# Patient Record
Sex: Male | Born: 1995 | Race: White | Hispanic: No | Marital: Single | Smoking: Never smoker
Health system: Southern US, Community
[De-identification: ages and names within clinical notes are randomized; demographics above are authoritative.]

## PROBLEM LIST (undated history)

## (undated) DIAGNOSIS — F32A Depression, unspecified: Secondary | ICD-10-CM

## (undated) DIAGNOSIS — F419 Anxiety disorder, unspecified: Secondary | ICD-10-CM

## (undated) HISTORY — PX: ANTERIOR CRUCIATE LIGAMENT REPAIR: SHX115

---
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
## 2020-05-12 ENCOUNTER — Emergency Department: Payer: BC Managed Care – PPO

## 2020-05-12 ENCOUNTER — Encounter: Payer: Self-pay | Admitting: Emergency Medicine

## 2020-05-12 ENCOUNTER — Inpatient Hospital Stay
Admission: EM | Admit: 2020-05-12 | Discharge: 2020-05-16 | DRG: 083 | Disposition: A | Discharge status: Home / Self Care | Payer: BC Managed Care – PPO | Attending: Internal Medicine | Admitting: Internal Medicine

## 2020-05-12 ENCOUNTER — Other Ambulatory Visit: Payer: Self-pay

## 2020-05-12 DIAGNOSIS — S065X9A Traumatic subdural hemorrhage with loss of consciousness of unspecified duration, initial encounter: Principal | ICD-10-CM | POA: Diagnosis present

## 2020-05-12 DIAGNOSIS — R4182 Altered mental status, unspecified: Secondary | ICD-10-CM

## 2020-05-12 DIAGNOSIS — Z20822 Contact with and (suspected) exposure to covid-19: Secondary | ICD-10-CM | POA: Diagnosis present

## 2020-05-12 DIAGNOSIS — W19XXXA Unspecified fall, initial encounter: Secondary | ICD-10-CM | POA: Diagnosis present

## 2020-05-12 DIAGNOSIS — R569 Unspecified convulsions: Secondary | ICD-10-CM

## 2020-05-12 DIAGNOSIS — S065XAA Traumatic subdural hemorrhage with loss of consciousness status unknown, initial encounter: Secondary | ICD-10-CM | POA: Diagnosis present

## 2020-05-12 DIAGNOSIS — I619 Nontraumatic intracerebral hemorrhage, unspecified: Secondary | ICD-10-CM | POA: Diagnosis present

## 2020-05-12 DIAGNOSIS — E876 Hypokalemia: Secondary | ICD-10-CM | POA: Diagnosis present

## 2020-05-12 DIAGNOSIS — Z79899 Other long term (current) drug therapy: Secondary | ICD-10-CM

## 2020-05-12 DIAGNOSIS — N179 Acute kidney failure, unspecified: Secondary | ICD-10-CM | POA: Diagnosis present

## 2020-05-12 DIAGNOSIS — Y92531 Health care provider office as the place of occurrence of the external cause: Secondary | ICD-10-CM

## 2020-05-12 DIAGNOSIS — R809 Proteinuria, unspecified: Secondary | ICD-10-CM | POA: Diagnosis present

## 2020-05-12 DIAGNOSIS — R55 Syncope and collapse: Secondary | ICD-10-CM | POA: Diagnosis present

## 2020-05-12 DIAGNOSIS — W1839XA Other fall on same level, initial encounter: Secondary | ICD-10-CM | POA: Diagnosis present

## 2020-05-12 DIAGNOSIS — R001 Bradycardia, unspecified: Secondary | ICD-10-CM

## 2020-05-12 LAB — URINALYSIS, COMPLETE (UACMP) WITH MICROSCOPIC
Bilirubin Urine: NEGATIVE
Glucose, UA: 50 mg/dL — AB
Ketones, ur: 5 mg/dL — AB
Leukocytes,Ua: NEGATIVE
Nitrite: NEGATIVE
Protein, ur: >=300 mg/dL — AB
Specific Gravity, Urine: 1.018 (ref 1.005–1.030)
Squamous Epithelial / LPF: NONE SEEN (ref 0–5)
pH: 5 (ref 5.0–8.0)

## 2020-05-12 LAB — BASIC METABOLIC PANEL
Anion gap: 13 (ref 5–15)
BUN: 11 mg/dL (ref 6–20)
CO2: 24 mmol/L (ref 22–32)
Calcium: 9.1 mg/dL (ref 8.9–10.3)
Chloride: 102 mmol/L (ref 98–111)
Creatinine, Ser: 1.13 mg/dL (ref 0.61–1.24)
GFR, Estimated: >60 mL/min (ref >60)
Glucose, Bld: 165 mg/dL — ABNORMAL HIGH (ref 70–99)
Potassium: 2.8 mmol/L — ABNORMAL LOW (ref 3.5–5.1)
Sodium: 139 mmol/L (ref 135–145)

## 2020-05-12 LAB — CBC
HCT: 41.3 % (ref 39.0–52.0)
Hemoglobin: 14.9 g/dL (ref 13.0–17.0)
MCH: 29.2 pg (ref 26.0–34.0)
MCHC: 36.1 g/dL — ABNORMAL HIGH (ref 30.0–36.0)
MCV: 81 fL (ref 80.0–100.0)
Platelets: 242 10*3/uL (ref 150–400)
RBC: 5.1 MIL/uL (ref 4.22–5.81)
RDW: 12.1 % (ref 11.5–15.5)
WBC: 12.2 10*3/uL — ABNORMAL HIGH (ref 4.0–10.5)
nRBC: 0 % (ref 0.0–0.2)

## 2020-05-12 LAB — MAGNESIUM: Magnesium: 1.9 mg/dL (ref 1.7–2.4)

## 2020-05-12 LAB — PHOSPHORUS: Phosphorus: 2.2 mg/dL — ABNORMAL LOW (ref 2.5–4.6)

## 2020-05-12 IMAGING — CT CT HEAD W/O CM
3 series · 16 of 47 positions shown, 19 images · non-contrast
Comparison: None.

CLINICAL DATA: Syncope.  Fall with head injury

EXAM:
CT HEAD WITHOUT CONTRAST
TECHNIQUE: Contiguous axial images were obtained from the base of the skull
through the vertex without intravenous contrast.

[Series 2: head wo · axial · 0.46mm/px · z∈[-112,+23]mm · 10 of 33 slices shown, 13 images]
[im 3/33  brain]
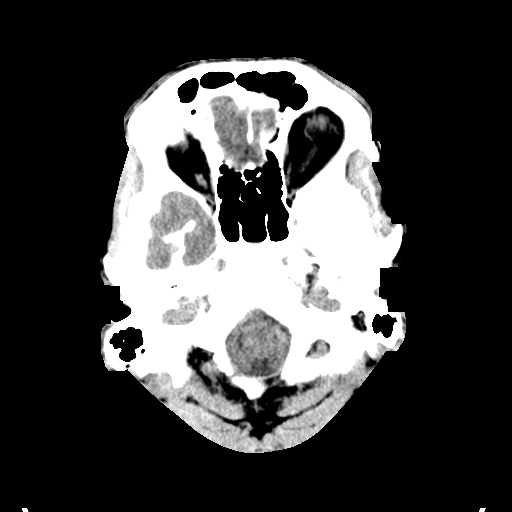
[im 3/33  bone]
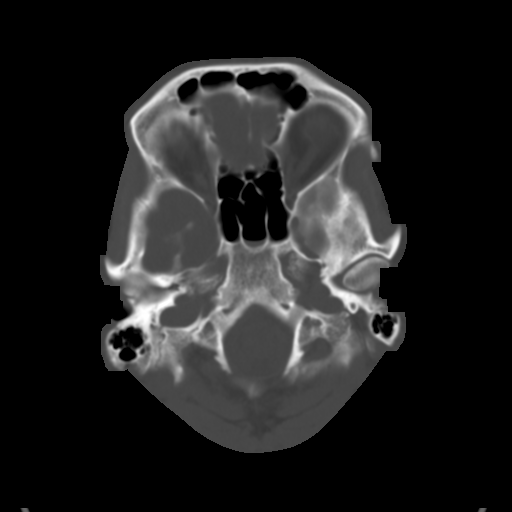
[im 6/33  brain]
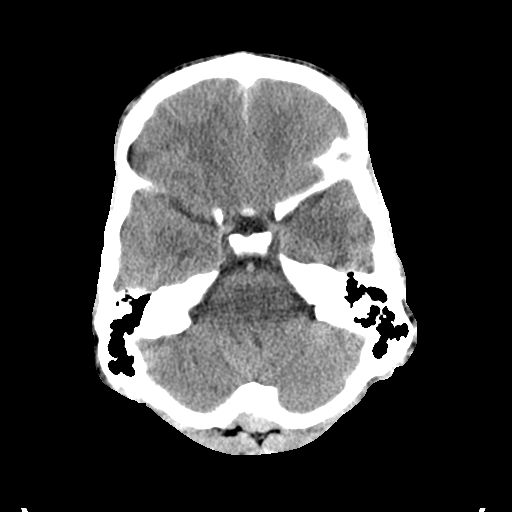
[im 9/33  brain]
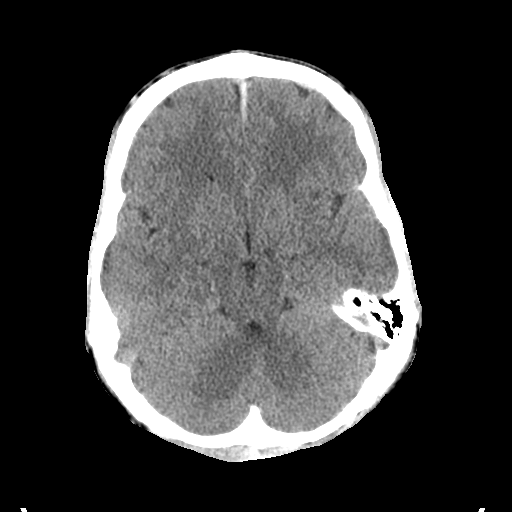
[im 12/33  brain]
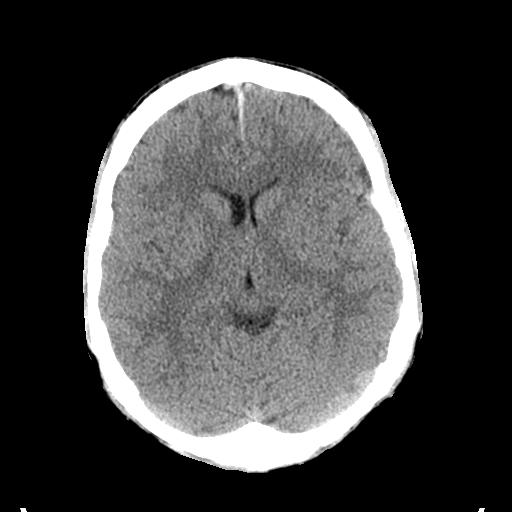
[im 15/33  brain]
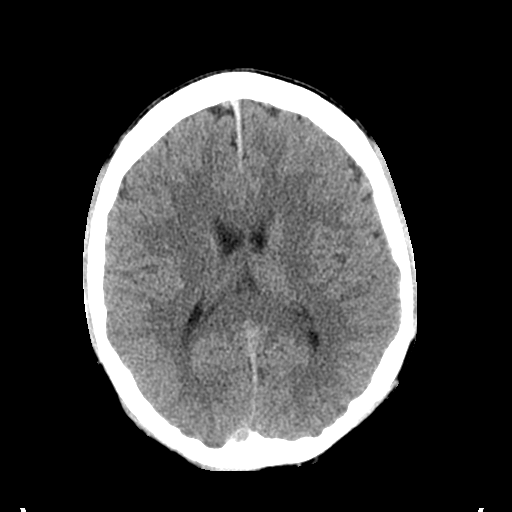
[im 15/33  bone]
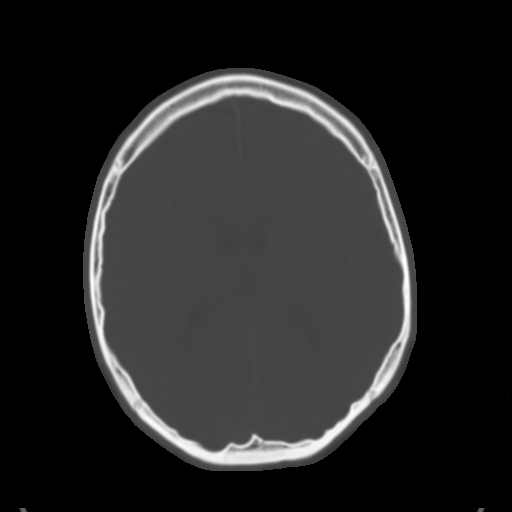
[im 18/33  brain]
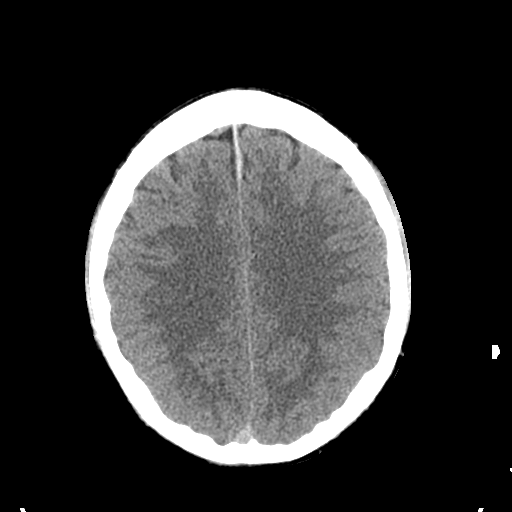
[im 21/33  brain]
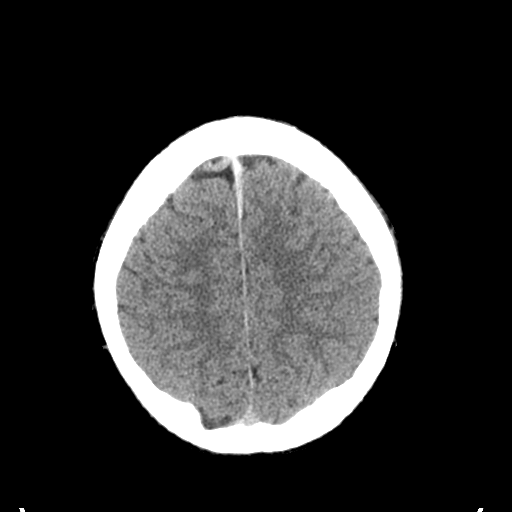
[im 25/33  brain]
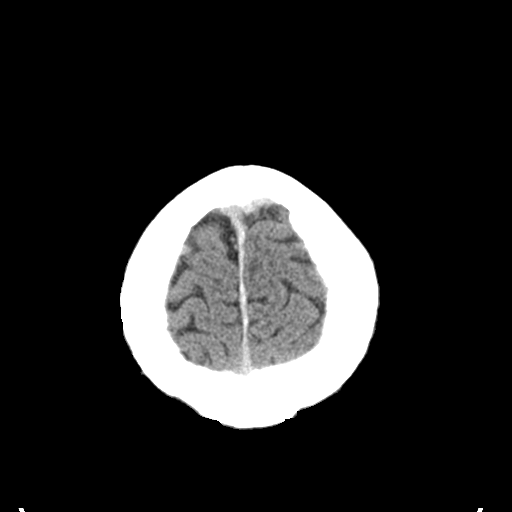
[im 27/33  brain]
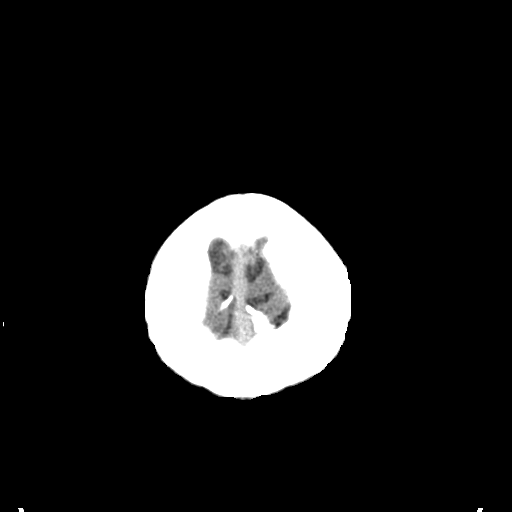
[im 27/33  bone]
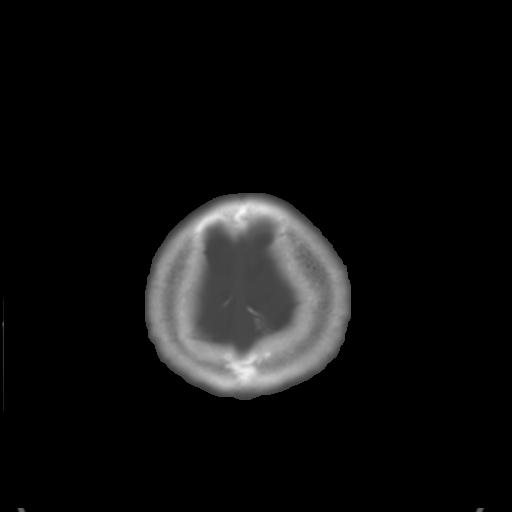
[im 30/33  brain]
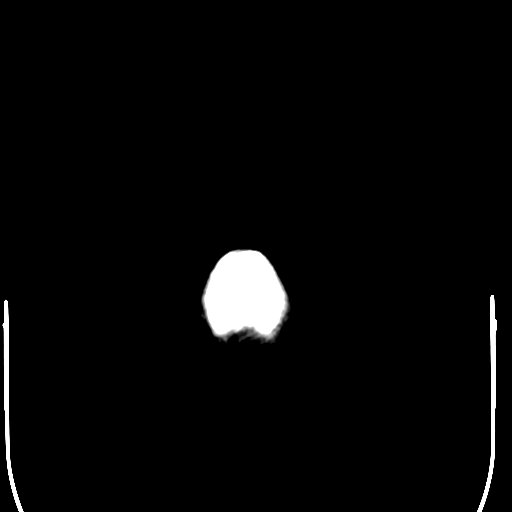

[Series 4: coronal soft tissue · coronal · 0.33mm/px · 3 of 67 slices shown]
[im 23/67  brain]
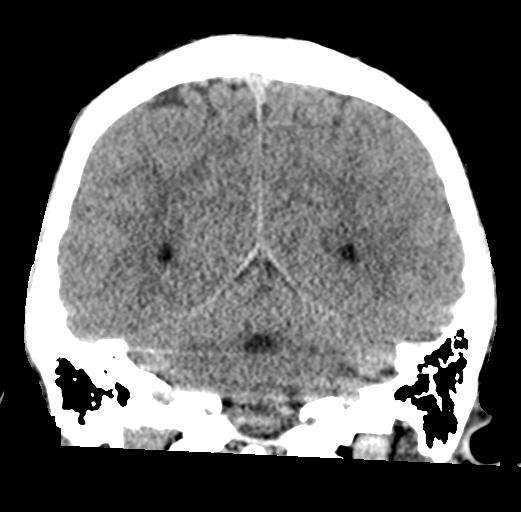
[im 30/67  brain]
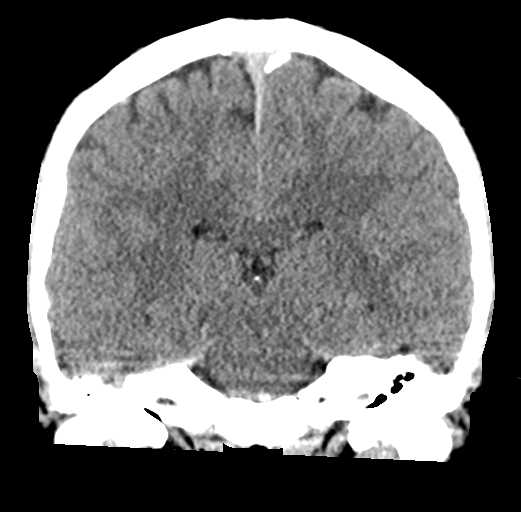
[im 37/67  brain]
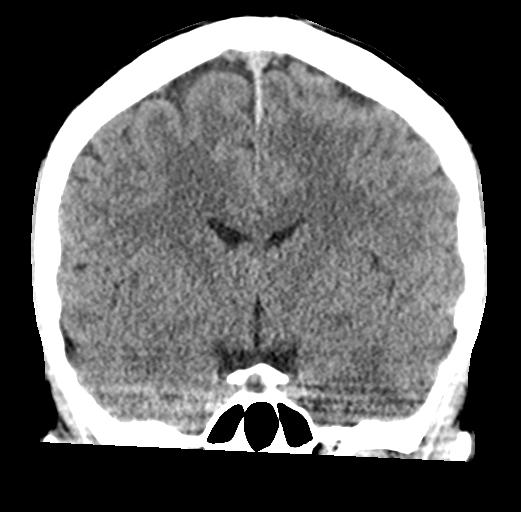

[Series 5: sagittal soft tissue · sagittal · 0.33mm/px · 3 of 58 slices shown]
[im 20/58  brain]
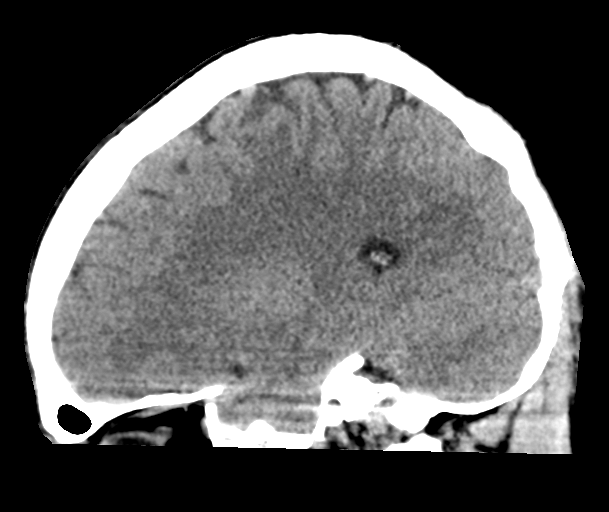
[im 29/58  brain]
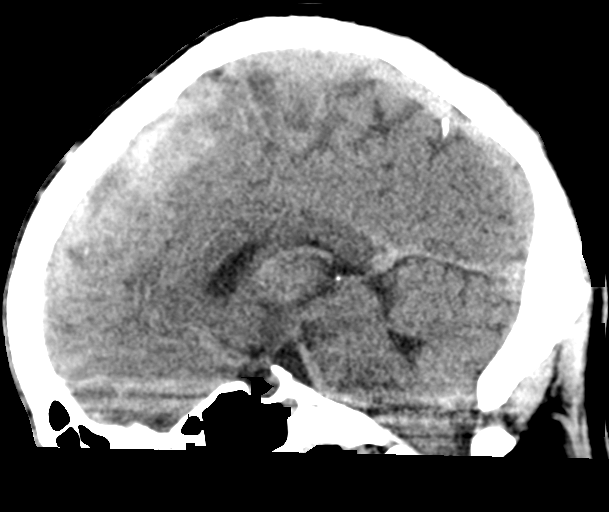
[im 39/58  brain]
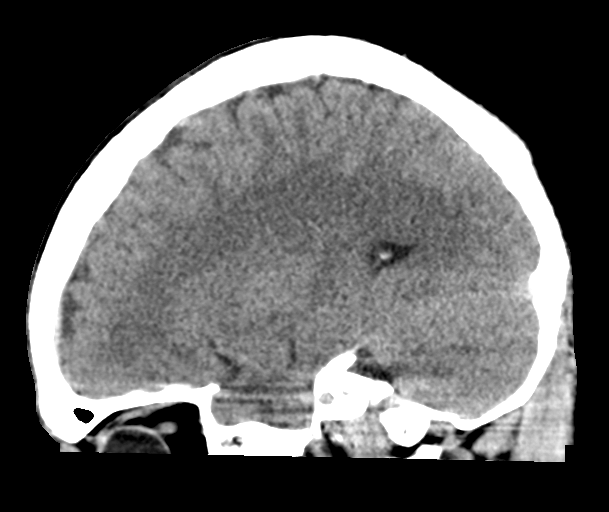

[16 of 47 positions shown; findings below may reference images not displayed]

FINDINGS: Brain: Acute interhemispheric subdural hematoma anteriorly measuring
approximately 3 mm in thickness. Additional small area of
hemorrhagic contusion in the left inferior frontal lobe measuring
approximately 5 mm. No subarachnoid hemorrhage.

Ventricle size normal.  Negative for acute infarct or mass lesion.

Vascular: Negative for hyperdense vessel

Skull: Right occipital skull fracture, acute appearing. Nondisplaced
fracture.

Sinuses/Orbits: Small air-fluid level in the sphenoid sinus. No
skull base fracture identified. Negative orbit

Other: None
IMPRESSION: 3 mm interhemispheric subdural hematoma anteriorly. Small
hemorrhagic contusion left inferior frontal lobe

Nondisplaced fracture right occipital bone.

These results were called by telephone at the time of interpretation
on [DATE] at [DATE] to provider LIAM, who verbally
acknowledged these results.

## 2020-05-12 IMAGING — CT CT HEAD W/O CM
2 of 3 series · 12 of 30 positions shown, 14 images · non-contrast
Comparison: CT head [DATE]

CLINICAL DATA: Fall.  Re-evaluate bleed

EXAM:
CT HEAD WITHOUT CONTRAST
CT CERVICAL SPINE WITHOUT CONTRAST
TECHNIQUE: Multidetector CT imaging of the head and cervical spine was
performed following the standard protocol without intravenous
contrast. Multiplanar CT image reconstructions of the cervical spine
were also generated.

[Series 4: coronal soft tissue · axial · 0.37mm/px · z∈[+738,+879]mm · 7 of 64 slices shown, 9 images (1 of 2)]
[im 8/64  brain]
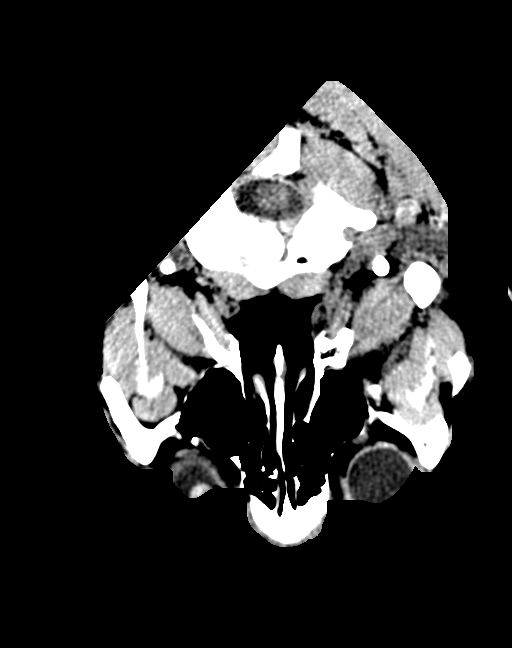
[im 8/64  bone]
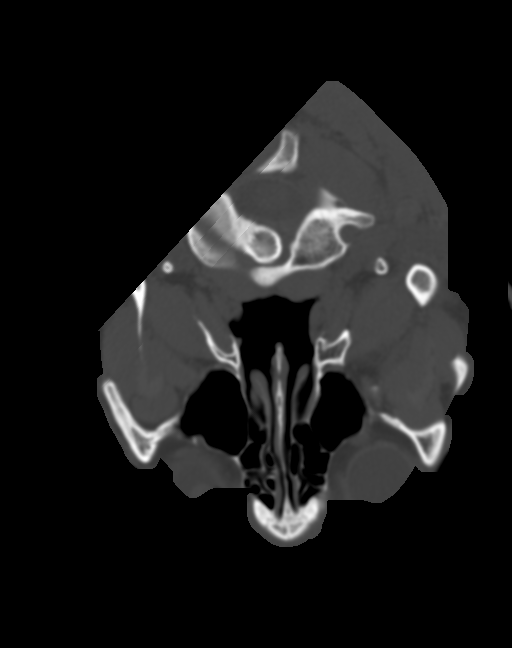
[im 16/64  brain]
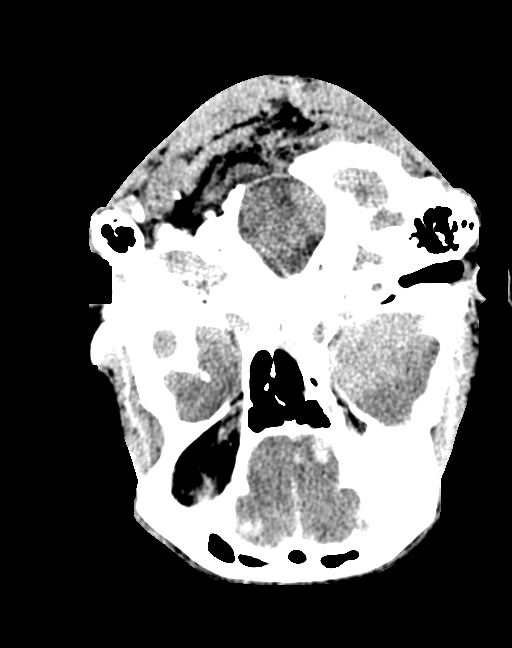
[im 24/64  brain]
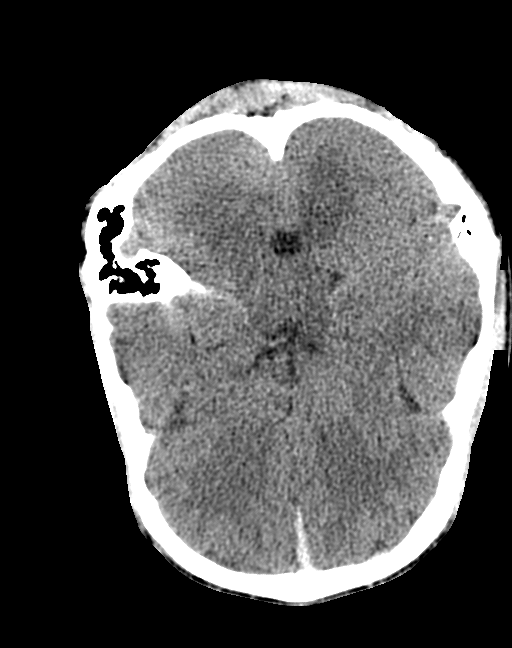
[im 32/64  brain]
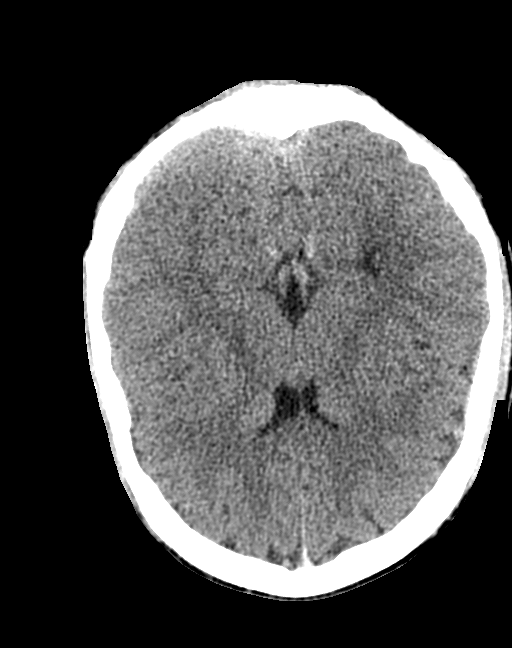
[im 40/64  brain]
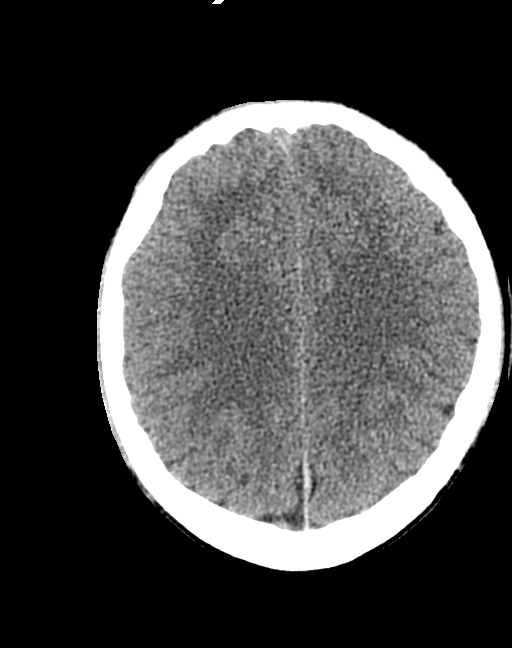
[im 40/64  bone]
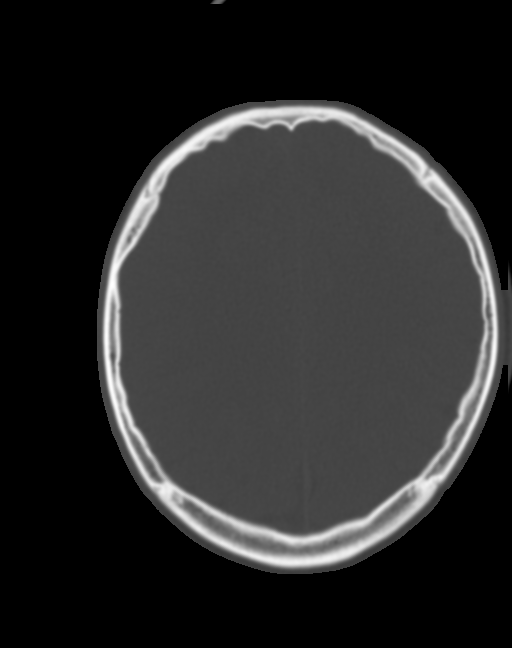
[im 48/64  brain]
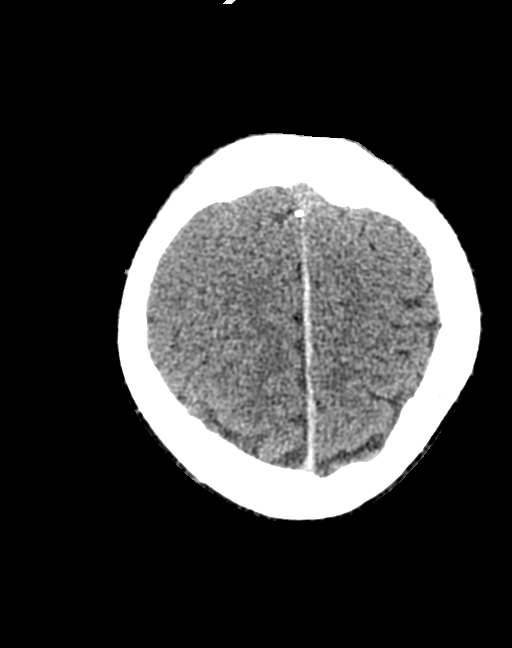
[im 56/64  brain]
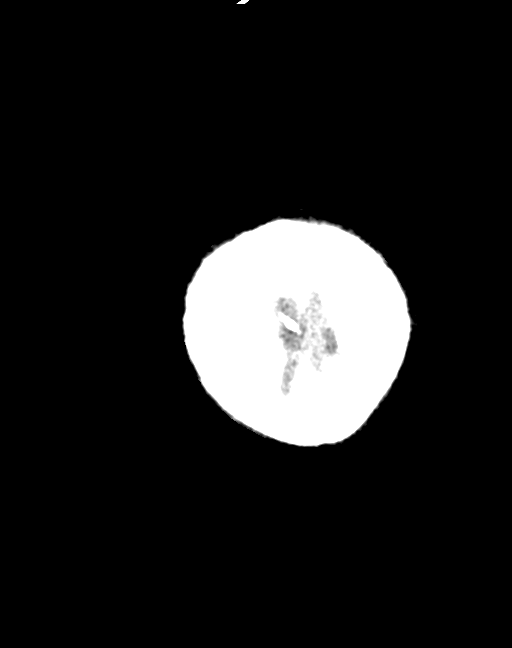

[Series 5: coronal soft tissue · axial · 0.37mm/px · z∈[+777,+867]mm · 5 of 62 slices shown (2 of 2)]
[im 8/62  brain]
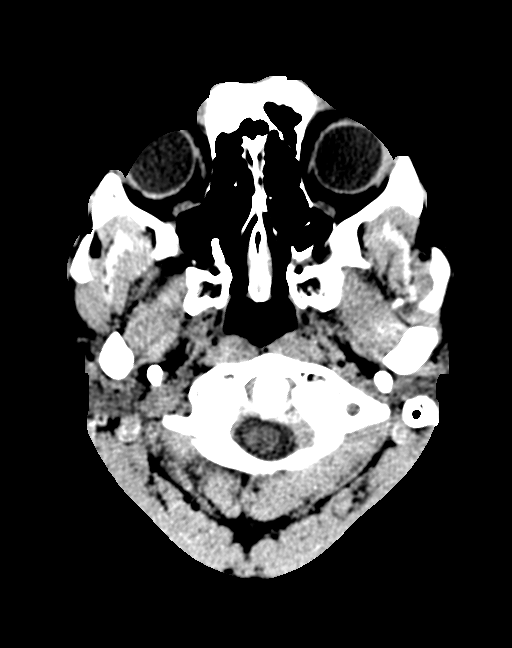
[im 16/62  brain]
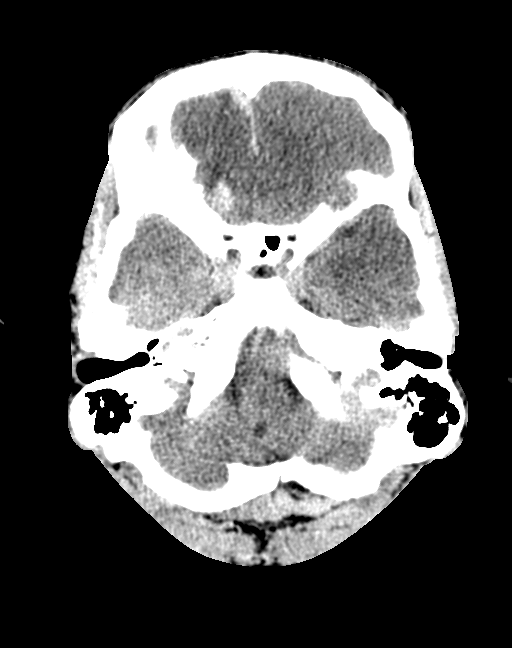
[im 23/62  brain]
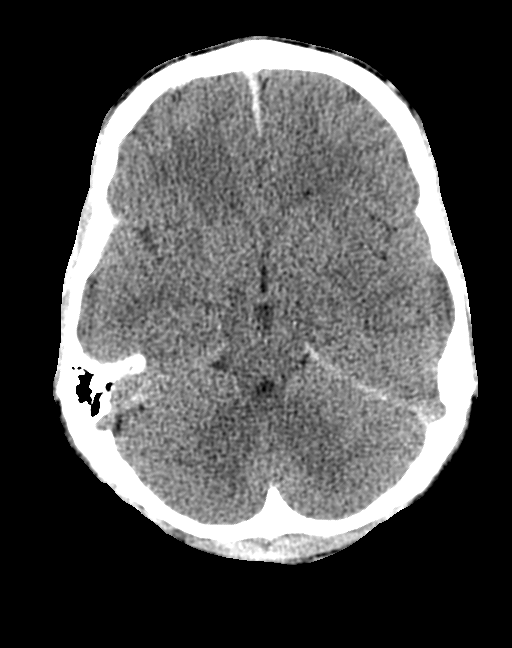
[im 31/62  brain]
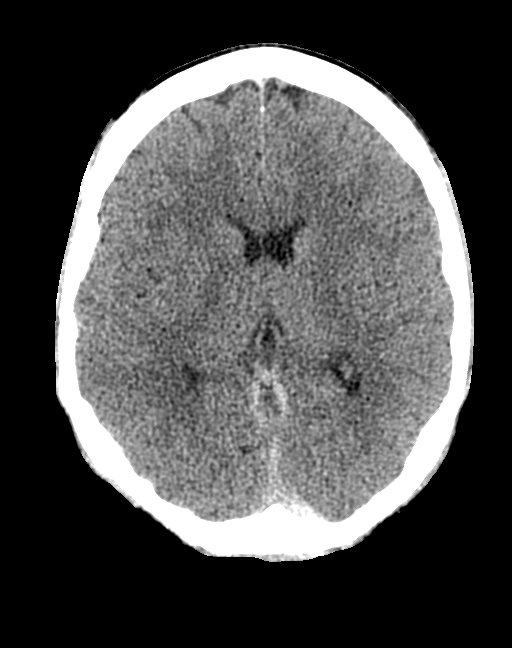
[im 39/62  brain]
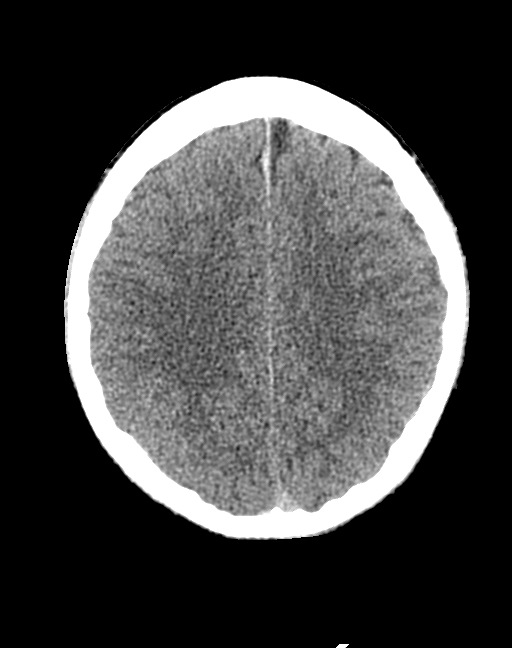

[12 of 30 positions shown; findings below may reference images not displayed]

FINDINGS: CT HEAD FINDINGS

Brain: Subdural hematoma again demonstrated along the left side of
the falx measuring about 5 mm maximal depth. Additional parenchymal
contusion demonstrated in the left inferior frontal lobe. Mild
progression in the size and conspicuity of the left inferior frontal
lobe hemorrhage since previous study. No new hemorrhage. No
significant mass effect or midline shift. No ventricular dilatation.
Gray-white matter junctions are distinct.

Vascular: No acute abnormalities.

Skull: Nondepressed basal skull fractures again demonstrated with
fracture lines extending to the foramen magnum.

Sinuses/Orbits: Air-fluid levels in the sphenoid sinus. Mastoid air
cells are clear.

Other: Note that the CT head was apparently reconstructed with
images flipped right to left with respect to the typical image
orientation.

CT CERVICAL SPINE FINDINGS

Alignment: Straightening of the usual cervical lordosis and cervical
scoliosis convex towards the right without anterior subluxation.
This is likely positional but muscle spasm could also have this
appearance. Normal alignment of the posterior elements. C1-2
articulation appears intact.

Skull base and vertebrae: Right basal occipital skull fractures
again demonstrated.

Soft tissues and spinal canal: No prevertebral soft tissue swelling.
No abnormal paraspinal soft tissue mass or infiltration.

Disc levels:  Intervertebral disc space heights are preserved.

Upper chest: Visualized lung apices are clear.

Other: None.
IMPRESSION: 1. Subdural hematoma again demonstrated along the left side of the
falx measuring about 5 mm maximal depth. No change.
2. Parenchymal contusions in the left inferior frontal lobe with
mild progression in size and conspicuity since previous study. No
significant mass effect or midline shift.
3. Nondepressed basal skull fractures again demonstrated.
4. Nonspecific straightening of usual cervical lordosis and cervical
scoliosis convex towards the right without anterior subluxation.
This is likely positional but muscle spasm could also have this
appearance.
5. No acute displaced fractures identified in the cervical spine.

## 2020-05-12 IMAGING — CT CT CERVICAL SPINE W/O CM
2 series · 10 of 14 positions shown, 12 images · non-contrast
Comparison: CT head [DATE]

CLINICAL DATA: Fall.  Re-evaluate bleed

EXAM:
CT HEAD WITHOUT CONTRAST
CT CERVICAL SPINE WITHOUT CONTRAST
TECHNIQUE: Multidetector CT imaging of the head and cervical spine was
performed following the standard protocol without intravenous
contrast. Multiplanar CT image reconstructions of the cervical spine
were also generated.

[Series 3: c spine soft · axial · 0.39mm/px · z∈[+640,+776]mm · 5 of 103 slices shown]
[im 18/103  soft-tissue]
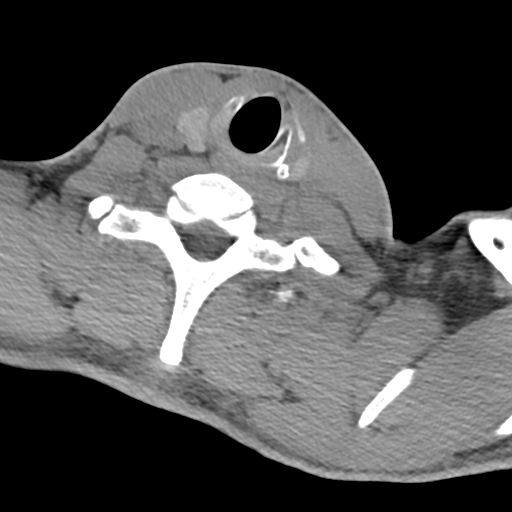
[im 35/103  soft-tissue]
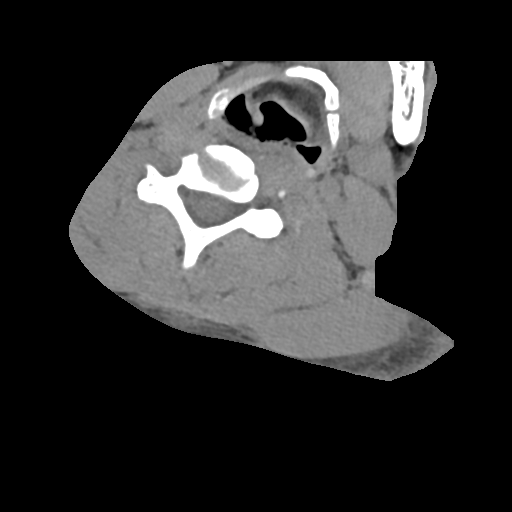
[im 52/103  soft-tissue]
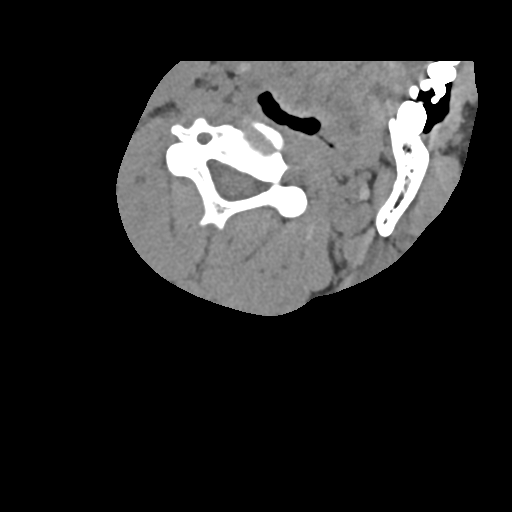
[im 69/103  soft-tissue]
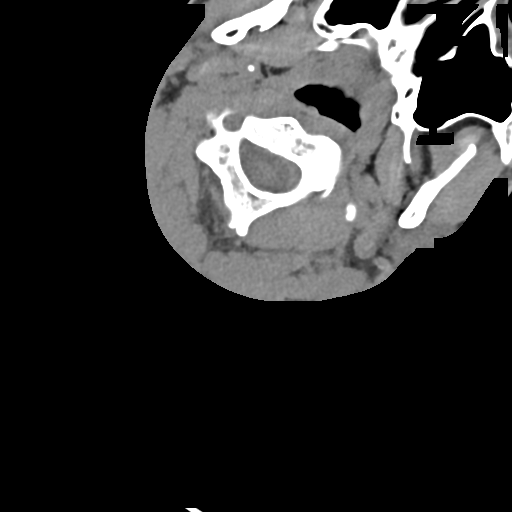
[im 86/103  soft-tissue]
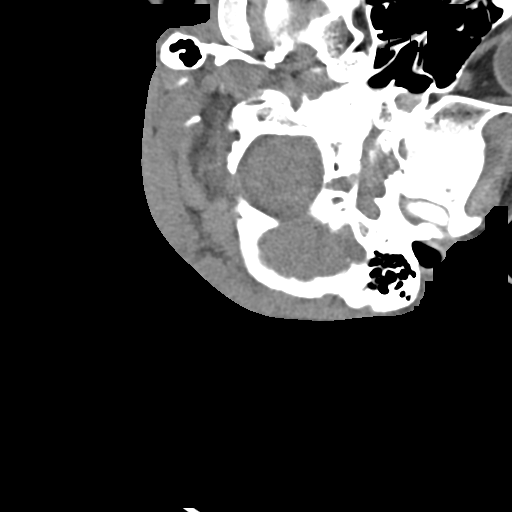

[Series 6: orthogonal bone · axial · 0.25mm/px · z∈[+635,+767]mm · 5 of 107 slices shown, 7 images]
[im 18/107  soft-tissue]
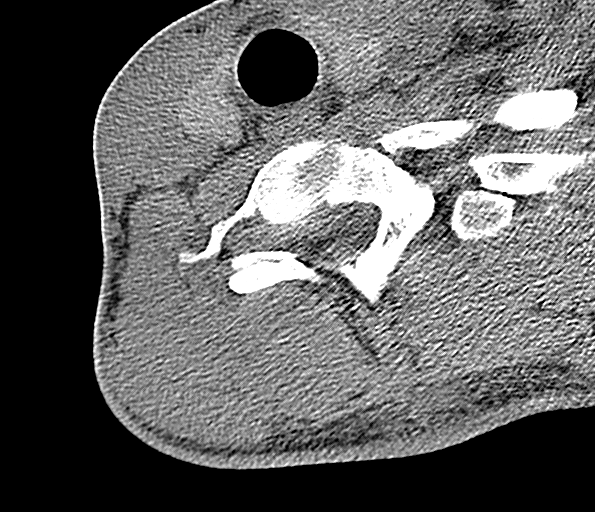
[im 18/107  bone]
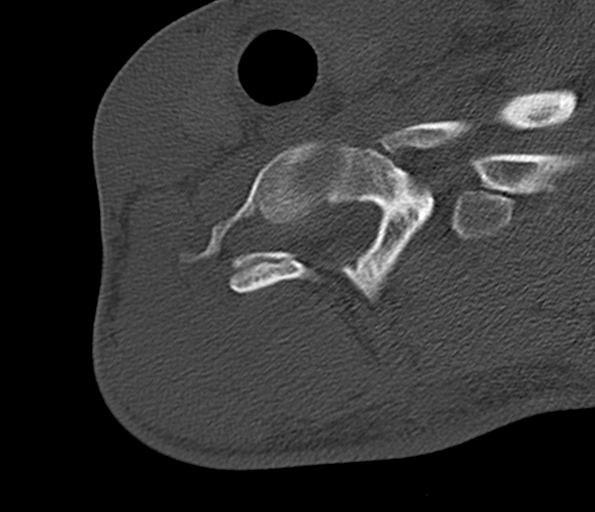
[im 36/107  bone]
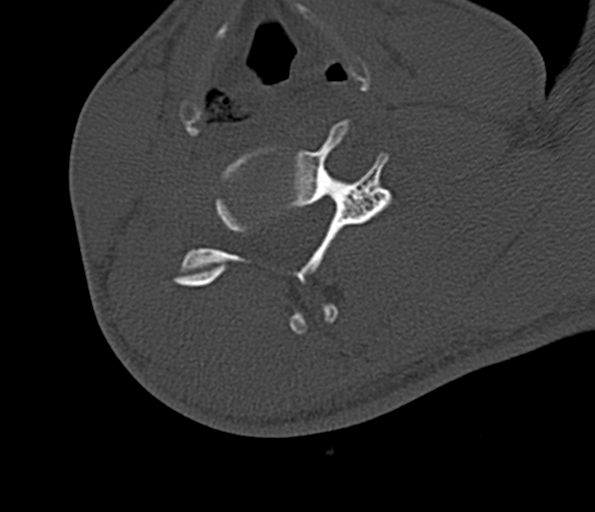
[im 54/107  bone]
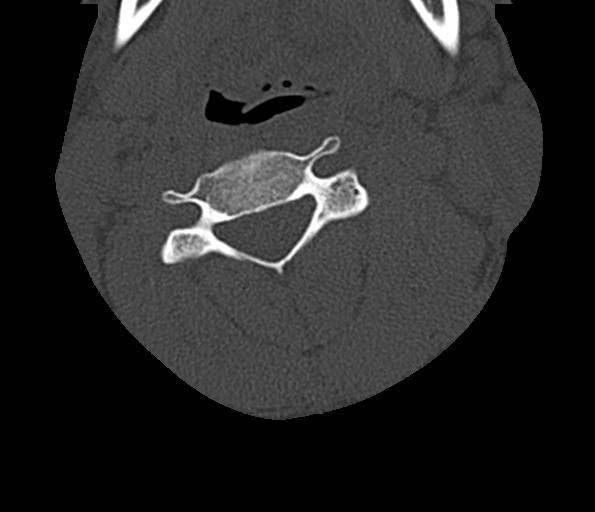
[im 71/107  bone]
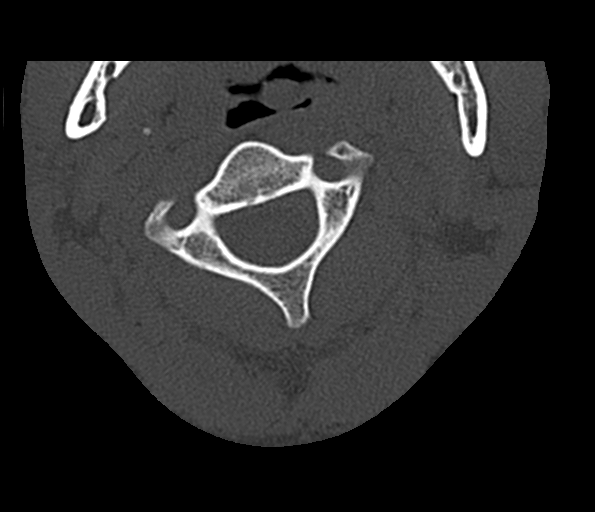
[im 89/107  soft-tissue]
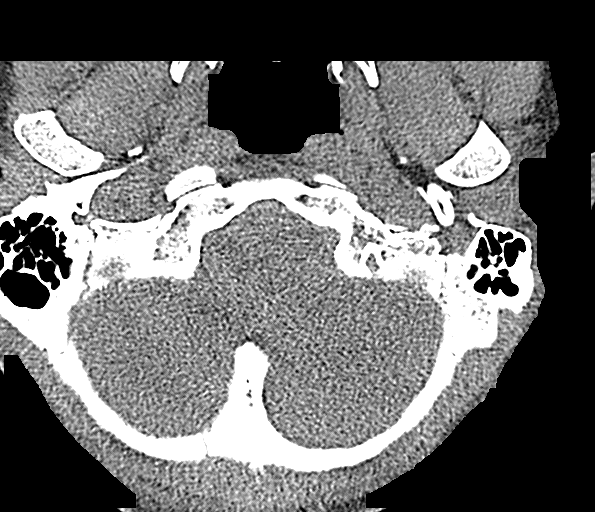
[im 89/107  bone]
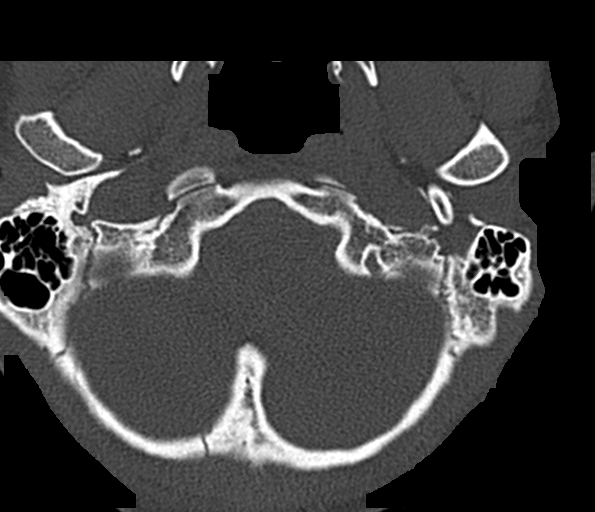

[10 of 14 positions shown; findings below may reference images not displayed]

FINDINGS: CT HEAD FINDINGS

Brain: Subdural hematoma again demonstrated along the left side of
the falx measuring about 5 mm maximal depth. Additional parenchymal
contusion demonstrated in the left inferior frontal lobe. Mild
progression in the size and conspicuity of the left inferior frontal
lobe hemorrhage since previous study. No new hemorrhage. No
significant mass effect or midline shift. No ventricular dilatation.
Gray-white matter junctions are distinct.

Vascular: No acute abnormalities.

Skull: Nondepressed basal skull fractures again demonstrated with
fracture lines extending to the foramen magnum.

Sinuses/Orbits: Air-fluid levels in the sphenoid sinus. Mastoid air
cells are clear.

Other: Note that the CT head was apparently reconstructed with
images flipped right to left with respect to the typical image
orientation.

CT CERVICAL SPINE FINDINGS

Alignment: Straightening of the usual cervical lordosis and cervical
scoliosis convex towards the right without anterior subluxation.
This is likely positional but muscle spasm could also have this
appearance. Normal alignment of the posterior elements. C1-2
articulation appears intact.

Skull base and vertebrae: Right basal occipital skull fractures
again demonstrated.

Soft tissues and spinal canal: No prevertebral soft tissue swelling.
No abnormal paraspinal soft tissue mass or infiltration.

Disc levels:  Intervertebral disc space heights are preserved.

Upper chest: Visualized lung apices are clear.

Other: None.
IMPRESSION: 1. Subdural hematoma again demonstrated along the left side of the
falx measuring about 5 mm maximal depth. No change.
2. Parenchymal contusions in the left inferior frontal lobe with
mild progression in size and conspicuity since previous study. No
significant mass effect or midline shift.
3. Nondepressed basal skull fractures again demonstrated.
4. Nonspecific straightening of usual cervical lordosis and cervical
scoliosis convex towards the right without anterior subluxation.
This is likely positional but muscle spasm could also have this
appearance.
5. No acute displaced fractures identified in the cervical spine.

## 2020-05-12 MED ORDER — LORAZEPAM 2 MG/ML IJ SOLN
INTRAMUSCULAR | Status: AC
  Administered 2020-05-12: 1 mg via INTRAVENOUS
  Filled 2020-05-12: qty 1

## 2020-05-12 MED ORDER — HALOPERIDOL LACTATE 5 MG/ML IJ SOLN: 2.5000 mg | Freq: Once | INTRAMUSCULAR | Status: AC

## 2020-05-12 MED ORDER — POTASSIUM CHLORIDE 10 MEQ/100ML IV SOLN
10.0000 meq | INTRAVENOUS | Status: AC
  Administered 2020-05-12 (×3): 10 meq via INTRAVENOUS
  Filled 2020-05-12 (×5): qty 100

## 2020-05-12 MED ORDER — LORAZEPAM 2 MG/ML IJ SOLN
1.0000 mg | Freq: Once | INTRAMUSCULAR | Status: AC
  Administered 2020-05-12: 1 mg via INTRAVENOUS

## 2020-05-12 MED ORDER — DIPHENHYDRAMINE HCL 50 MG/ML IJ SOLN
50.0000 mg | Freq: Once | INTRAMUSCULAR | Status: AC
  Administered 2020-05-12: 50 mg via INTRAVENOUS
  Filled 2020-05-12: qty 1

## 2020-05-12 MED ORDER — LEVETIRACETAM IN NACL 1500 MG/100ML IV SOLN
1500.0000 mg | Freq: Once | INTRAVENOUS | Status: AC
  Administered 2020-05-12: 1500 mg via INTRAVENOUS
  Filled 2020-05-12: qty 100

## 2020-05-12 MED ORDER — HALOPERIDOL LACTATE 5 MG/ML IJ SOLN
INTRAMUSCULAR | Status: AC
  Administered 2020-05-12: 2.5 mg via INTRAVENOUS
  Filled 2020-05-12: qty 1

## 2020-05-12 MED ORDER — LORAZEPAM 2 MG/ML IJ SOLN
1.0000 mg | Freq: Once | INTRAMUSCULAR | Status: AC | PRN
  Administered 2020-05-13: 1 mg via INTRAVENOUS
  Filled 2020-05-12 (×2): qty 1

## 2020-05-12 MED ORDER — LORAZEPAM 2 MG/ML IJ SOLN: 1.0000 mg | Freq: Once | INTRAMUSCULAR | Status: AC

## 2020-05-12 MED ORDER — LACOSAMIDE 50 MG PO TABS: 100.0000 mg | ORAL_TABLET | Freq: Two times a day (BID) | ORAL | Status: DC

## 2020-05-12 MED ORDER — HALOPERIDOL LACTATE 5 MG/ML IJ SOLN
2.5000 mg | Freq: Once | INTRAMUSCULAR | Status: AC
  Administered 2020-05-12: 2.5 mg via INTRAVENOUS
  Filled 2020-05-12: qty 1

## 2020-05-12 NOTE — Progress Notes (Signed)
   05/12/20 1700  Clinical Encounter Type  Visited With Patient and family together  Visit Type Initial  Referral From Nurse  Consult/Referral To Chaplain  Chaplain responded to telephone call from ED, saying Pt had been unresponsive and chaplain should come and be with Pt's father. Shortly after chaplain arrived, the doctor came out to speak with Pt's father. After doctor finished talking with his father, he asked me to go and be with his wife in the waiting room. Before chaplain got to the waiting room, security met chaplain there, saying they were taking Pt's mother to the back. Chaplain escorted chaplain's Pt to his room and she stepped out, telling parents if they need her to let her know.

## 2020-05-12 NOTE — ED Triage Notes (Addendum)
Pt comes into the ED via [facility] from [facility] where he had a syncopal episode while walking to the check out line at his MD's office.  Pt was having blood drawn and he does not do well with blood draws so he started not feeling well.  When he got up and made it to the check out counter, that is when he fell straight back and hit his head.  Pt presents confused and unable to answer all question.  Pt is pale in color at this time with dried blood in his left nare.  Pt also presents with nausea and vomiting. Pt does not take any blood thinners. Pt has even and unlabored respirations at this time.

## 2020-05-12 NOTE — ED Notes (Signed)
Neuro consult at bedside.

## 2020-05-12 NOTE — Progress Notes (Signed)
Consulted regarding patient who was noted to have fall and recent seizure and has not returned to baseline but no obvious seizure activity. CT Head shows small amount of interhemispheric blood but no mass lesions. Given seizure, recommended giving Ativan and loading Keppra. Given patient not back to baseline, recommend EEG to rule out any ongoing seizures. Patient needs to be monitored and recommend neurology consult. No indication for cranial intervention at this time but patient needs to have a repeat CT of the head in 6 hours to monitor for any new injuries.

## 2020-05-12 NOTE — ED Notes (Addendum)
MRI at bedside stating a RN needs to be present with pt in MRI to remain on medical monitors. Charge RN made aware.

## 2020-05-12 NOTE — ED Notes (Addendum)
Pt gaze fixed to the right

## 2020-05-12 NOTE — ED Notes (Signed)
MD informed of splotchy red rash on pt's neck, face, and a little on his upper chest.

## 2020-05-12 NOTE — ED Notes (Signed)
Pharmacy called to send up IV potassium. ED pyxis out of stock at this time.

## 2020-05-12 NOTE — ED Provider Notes (Signed)
[facility] Emergency Department Provider Note   ____________________________________________   I have reviewed the triage vital signs and the nursing notes.   HISTORY  Chief Complaint Loss of Consciousness and Fall   History limited by and level 5 caveat due to: Altered mental status.    HPI Steven Gill is a 24 y.o. male who presents to the emergency department today because of concern for fall. Apparently the patient was at his doctors office getting blood drawn today. He typically gets woozy when he has his blood drawn. Apparently today the patient had a true syncopal episode and fell backwards hitting his head. Since then father stated that immediately afterwards he was confused. He did not return to baseline. While in the waiting room here he had a seizure. Patient has not history of epilepsy.   Records reviewed.   History reviewed. No pertinent past medical history.  There are no problems to display for this patient.   History reviewed. No pertinent surgical history.  Prior to Admission medications   Medication Sig Start Date End Date Taking? Authorizing Provider  meloxicam (MOBIC) 15 MG tablet Take 1 tablet (15 mg total) by mouth daily. 07/15/15   Cuthriell, Delorise Royals, PA-C    Allergies Patient has no known allergies.  History reviewed. No pertinent family history.  Social History Social History   Tobacco Use  . Smoking status: Never Smoker  . Smokeless tobacco: Never Used  Substance Use Topics  . Alcohol use: No  . Drug use: No    Review of Systems Unable to obtain secondary to altered mental status.  ____________________________________________   PHYSICAL EXAM:  VITAL SIGNS: ED Triage Vitals [05/12/20 1622]  Enc Vitals Group     BP (!) 168/113     Pulse Rate 85     Resp 17     Temp 97.8 F (36.6 C)     Temp Source Oral     SpO2 97 %     Weight 185 lb (83.9 kg)     Height 6\' 2"  (1.88 m)     Head Circumference       Peak Flow      Pain Score 0   Constitutional: Minimally responsive to painful stimuli.   Eyes: Conjunctivae are normal.  ENT      Head: Normocephalic and atraumatic.      Nose: No congestion/rhinnorhea.      Mouth/Throat: Mucous membranes are moist.      Neck: No stridor. Hematological/Lymphatic/Immunilogical: No cervical lymphadenopathy. Cardiovascular: Tachycardia, regular rhythm.  No murmurs, rubs, or gallops.  Respiratory: Normal respiratory effort without tachypnea nor retractions. Breath sounds are clear and equal bilaterally. No wheezes/rales/rhonchi. Gastrointestinal: Soft and non tender. No rebound. No guarding.  Genitourinary: Deferred Musculoskeletal: Normal range of motion in all extremities. No lower extremity edema. Neurologic: Responsive to painful stimuli. Moving all extremities. Some gaze deviation to the upper left. Skin:  Skin is warm, dry and intact. No rash noted. ____________________________________________    LABS (pertinent positives/negatives)  Phosphorus 2.2 Magnesium 1.9 BMP na 139, k 2.8, glu 165, cr 1.13 UA clear, small hgb dipstick, 0-5 rbc and wbc CBC wbc 12.2, hgb 14.9, plt 242 ____________________________________________   EKG  I, , attending physician, personally viewed and interpreted this EKG  EKG Time: 1652 Rate: 100 Rhythm: sinus tachycardia Axis: normal Intervals: qtc 564 QRS: incomplete RBBB ST changes: no st elevation Impression: abnormal ekg  ____________________________________________    RADIOLOGY  CT head 3 mm interhemispheric  subdural hematoma anteriorly. Small hemorrhagic contusion left inferior frontal lobe  Nondisplaced fracture right occipital bone.   ____________________________________________   PROCEDURES  Procedures  CRITICAL CARE Performed by: Phineas Semen   Total critical care time: 45 minutes  Critical care time was exclusive of separately billable procedures and treating  other patients.  Critical care was necessary to treat or prevent imminent or life-threatening deterioration.  Critical care was time spent personally by me on the following activities: development of treatment plan with patient and/or surrogate as well as nursing, discussions with consultants, evaluation of patient's response to treatment, examination of patient, obtaining history from patient or surrogate, ordering and performing treatments and interventions, ordering and review of laboratory studies, ordering and review of radiographic studies, pulse oximetry and re-evaluation of patient's condition.  ____________________________________________   INITIAL IMPRESSION / ASSESSMENT AND PLAN / ED COURSE  Pertinent labs & imaging results that were available during my care of the patient were reviewed by me and considered in my medical decision making (see chart for details).   Patient presented to the emergency department today after a syncopal episode and fall.  This occurred after he got blood drawn.  Do think likely vasovagal because of the fall.  However the patient was acting abnormally for family after the fall and had a seizure here in the waiting room.  The time my exam the patient was minimally responsive to painful stimuli.  He did have a gaze deviation.  Head CT was performed which showed an occipital fracture as well as a small subdural bleed.  This finding was discussed with the parents.  Patient was given Ativan as well as Keppra for seizure.  Dr. Adriana Simas with neurosurgery was contacted who recommended a 6-hour repeat.  Did have teleneurology Dr. Iver Nestle evaluate.  She did not think patient had continued seizure activity at this time.  She did not recommend MRI.  Additionally she would recommend continuing Keppra 1 week.  While waiting for repeat head CT patient did start to become more responsive.  He was able to respond with words however he then became quite agitated.  He was trying to get out  of the bed and pull on his lines.  Because of this he was given medication to help calm him down and relax him.  Patient's care was signed out to oncoming doctor awaiting repeat head CT. Would anticipate admission.  ____________________________________________   FINAL CLINICAL IMPRESSION(S) / ED DIAGNOSES  Final diagnoses:  Syncope, unspecified syncope type  Subdural hematoma (HCC)  Seizure (HCC)  Altered mental status, unspecified altered mental status type     Note: This dictation was prepared with Dragon dictation. Any transcriptional errors that result from this process are unintentional     Phineas Semen, MD 05/12/20 2322

## 2020-05-12 NOTE — Consult Note (Addendum)
[facility] Telemedicine Consult   Requesting Provider: Phineas Semen Consult Participants: Bedside nurse Cathie Beams, patient Location of the provider: [place], [place] Location of the patient: [facility] ED  This consult was provided via telemedicine with 2-way video and audio communication. The patient/family was informed that care would be provided in this way and agreed to receive care in this manner.   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  However he has been struggling recently with anxiety in the setting of starting college and the ongoing COVID-19 pandemic.  He was scheduled for a doctor's appointment today for further evaluation of this and consideration of medication.  He has a history of severe needle phobia (such that he is the only non-COVID-19 vaccinated member of his family), and this appointment included a blood draw.  After the blood draw he was observed for some time and then stood up and made it to the checkout counter before he fell.  Father assumes he fell backwards as the patient was not complaining of pain in the back of his head.  He had a generalized tonic-clonic seizure in the lobby of the ED, for which he was given Ativan and 1500 mg of Keppra.  Head CT showed a small subdural hemorrhage  Father reports no prior history of seizure activity.  He states the patient does not take any medications regularly.  He has not had any recent complaints about his health other than anxiety.  He was in his normal state of health, had stayed up late watching TV/movies with his brother and slept his normal 8 hours prior to his appointment.  He had not eaten today as he had woken up just prior to his appointment, and this is not unusual behavior for him.  Time of teleneurologist evaluation: 5:20 PM  Exam: Vitals:    05/12/20 1700 05/12/20 1705  BP: (!) 149/82   Pulse: 90 90  Resp: (!) 28 (!) 23  Temp:    SpO2: 100% 100%    General:  Well-developed well-nourished young man,  Initially turning to the side to vomit Breathing comfortably on room air, good oxygen saturation Initial mild tachycardia to the 120s which improved to the 100s over the course of my evaluation Initial mild hypertension to the 160s which improved to the 120s over the course of my evaluation  Neurological examination Pupils equal round reactive to light per nursing at bedside.  Intermittently did have some gaze to the left but would return to midline or roving eye movements with stimulation.  With stimulation had purposeful movement of all 4 extremities, grossly symmetric.  Did not follow any commands.  Would not open eyes to voice.  On brief reevaluation at 7:15 PM, parents confirmed that the patient was gradually waking up more and more though still not verbal.  They confirmed he had had no further shaking movements no concern for any gaze deviation  Imaging Reviewed:  Head CT with slight subarachnoid hemorrhage  Labs reviewed in epic and pertinent values follow: Hypokalemia to 2.8  Assessment: This is a 24 year old gentleman with no significant past medical history presenting with likely post concussive convulsions with minor subarachnoid hemorrhage.  Will obtain MRI to better characterize potential TBI, as well as vessel imaging to rule out dissection  Recommendations:  - Replete potassium, check Mg and Phos and replete as needed - CK -  UA - UTox - MRI brain, can time for 12:30 AM and use as 6 hour stability scan - MRA head and neck - No need for EEG if patient is continuing to gradually improve - q53min neuro checks, repage neurology for any worsening of neurologic status - MRA head without contrast and neck with contrast - Observation for return to baseline - 7-day course of Vimpat 100 mg twice daily per  standard guidelines for seizure prophylaxis in traumatic SAH  This patient is receiving care for possible acute neurological changes. There was 50 minutes of care by this provider at the time of service, including time for direct evaluation via telemedicine, review of medical records, imaging studies and discussion of findings with providers, the patient and/or family.  Brooke Dare MD-PhD [facility] [phone]    If 8pm- 8am, please page neurology on call as listed in AMION.

## 2020-05-12 NOTE — ED Triage Notes (Signed)
First RN Note: pt to ED via [facility] with c/o fall. Per EMS pt fell and hit head, went to [facility] to establish care today and when he went to leave he fell and hit his head. Per EMS after pt got up he was disoriented and did not remember the event and was not able to appropriately answer questions.

## 2020-05-12 NOTE — ED Provider Notes (Signed)
-----------------------------------------   12:05 AM on 05/13/2020 -----------------------------------------  Discussed repeat head CT with Dr. Adriana Simas from neurosurgery; no progression of tiny subdural hemorrhage which is nonoperative.  Agrees with admission to our facility.  Will discuss with hospitalist services for admission.  Updated parents who are at bedside.  Patient currently sleeping.   ----------------------------------------- 2:20 AM on 05/13/2020 -----------------------------------------  Patient required several doses of IV Ativan to keep supine and still during MRI.  No seizure activity noted.  MRI/MRA completed; awaiting results.  ----------------------------------------- 3:31 AM on 05/13/2020 -----------------------------------------  MRI/MRA brain and neck interpreted per Dr. Chase Picket:  1. Multifocal hemorrhagic contusion within the anterior left  temporal lobe and both anterior frontal lobes. Lateral left frontal  contusion is increased in size.  2. No midline shift or other mass effect.  3. Small volume subdural blood over the anterior left convexity and  within the inter hemispheric fissure.  4. Normal MRA of the head and neck.      Irean Hong, MD 05/13/20 [id number]

## 2020-05-12 NOTE — ED Notes (Signed)
Neurologist stating q44min neuro checks.

## 2020-05-13 ENCOUNTER — Emergency Department: Payer: BC Managed Care – PPO

## 2020-05-13 ENCOUNTER — Encounter: Payer: Self-pay | Admitting: Internal Medicine

## 2020-05-13 DIAGNOSIS — E876 Hypokalemia: Secondary | ICD-10-CM | POA: Diagnosis present

## 2020-05-13 DIAGNOSIS — Z20822 Contact with and (suspected) exposure to covid-19: Secondary | ICD-10-CM | POA: Diagnosis present

## 2020-05-13 DIAGNOSIS — N179 Acute kidney failure, unspecified: Secondary | ICD-10-CM | POA: Diagnosis present

## 2020-05-13 DIAGNOSIS — S065X9A Traumatic subdural hemorrhage with loss of consciousness of unspecified duration, initial encounter: Secondary | ICD-10-CM | POA: Diagnosis present

## 2020-05-13 DIAGNOSIS — R569 Unspecified convulsions: Secondary | ICD-10-CM | POA: Diagnosis present

## 2020-05-13 DIAGNOSIS — R809 Proteinuria, unspecified: Secondary | ICD-10-CM | POA: Diagnosis present

## 2020-05-13 DIAGNOSIS — R001 Bradycardia, unspecified: Secondary | ICD-10-CM | POA: Diagnosis not present

## 2020-05-13 DIAGNOSIS — S065XAA Traumatic subdural hemorrhage with loss of consciousness status unknown, initial encounter: Secondary | ICD-10-CM | POA: Diagnosis present

## 2020-05-13 DIAGNOSIS — W1839XA Other fall on same level, initial encounter: Secondary | ICD-10-CM | POA: Diagnosis present

## 2020-05-13 DIAGNOSIS — Z79899 Other long term (current) drug therapy: Secondary | ICD-10-CM | POA: Diagnosis not present

## 2020-05-13 DIAGNOSIS — Y92531 Health care provider office as the place of occurrence of the external cause: Secondary | ICD-10-CM | POA: Diagnosis not present

## 2020-05-13 DIAGNOSIS — R55 Syncope and collapse: Secondary | ICD-10-CM | POA: Diagnosis not present

## 2020-05-13 LAB — CBC WITH DIFFERENTIAL/PLATELET
Abs Immature Granulocytes: 0.07 10*3/uL (ref 0.00–0.07)
Basophils Absolute: 0 10*3/uL (ref 0.0–0.1)
Basophils Relative: 0 %
Eosinophils Absolute: 0 10*3/uL (ref 0.0–0.5)
Eosinophils Relative: 0 %
HCT: 40.6 % (ref 39.0–52.0)
Hemoglobin: 14.3 g/dL (ref 13.0–17.0)
Immature Granulocytes: 1 %
Lymphocytes Relative: 3 %
Lymphs Abs: 0.3 10*3/uL — ABNORMAL LOW (ref 0.7–4.0)
MCH: 28.8 pg (ref 26.0–34.0)
MCHC: 35.2 g/dL (ref 30.0–36.0)
MCV: 81.7 fL (ref 80.0–100.0)
Monocytes Absolute: 0.7 10*3/uL (ref 0.1–1.0)
Monocytes Relative: 5 %
Neutro Abs: 11.9 10*3/uL — ABNORMAL HIGH (ref 1.7–7.7)
Neutrophils Relative %: 91 %
Platelets: 160 10*3/uL (ref 150–400)
RBC: 4.97 MIL/uL (ref 4.22–5.81)
RDW: 12 % (ref 11.5–15.5)
WBC: 13 10*3/uL — ABNORMAL HIGH (ref 4.0–10.5)
nRBC: 0 % (ref 0.0–0.2)

## 2020-05-13 LAB — URINE DRUG SCREEN, QUALITATIVE (ARMC ONLY)
Amphetamines, Ur Screen: NOT DETECTED
Barbiturates, Ur Screen: NOT DETECTED
Benzodiazepine, Ur Scrn: NOT DETECTED
Cannabinoid 50 Ng, Ur ~~LOC~~: POSITIVE — AB
Cocaine Metabolite,Ur ~~LOC~~: NOT DETECTED
MDMA (Ecstasy)Ur Screen: NOT DETECTED
Methadone Scn, Ur: NOT DETECTED
Opiate, Ur Screen: NOT DETECTED
Phencyclidine (PCP) Ur S: NOT DETECTED
Tricyclic, Ur Screen: NOT DETECTED

## 2020-05-13 LAB — COMPREHENSIVE METABOLIC PANEL
ALT: 28 U/L (ref 0–44)
AST: 40 U/L (ref 15–41)
Albumin: 4.7 g/dL (ref 3.5–5.0)
Alkaline Phosphatase: 48 U/L (ref 38–126)
Anion gap: 11 (ref 5–15)
BUN: 17 mg/dL (ref 6–20)
CO2: 21 mmol/L — ABNORMAL LOW (ref 22–32)
Calcium: 9.2 mg/dL (ref 8.9–10.3)
Chloride: 106 mmol/L (ref 98–111)
Creatinine, Ser: 2.29 mg/dL — ABNORMAL HIGH (ref 0.61–1.24)
GFR, Estimated: 40 mL/min — ABNORMAL LOW (ref >60)
Glucose, Bld: 121 mg/dL — ABNORMAL HIGH (ref 70–99)
Potassium: 4.7 mmol/L (ref 3.5–5.1)
Sodium: 138 mmol/L (ref 135–145)
Total Bilirubin: 1.9 mg/dL — ABNORMAL HIGH (ref 0.3–1.2)
Total Protein: 7.8 g/dL (ref 6.5–8.1)

## 2020-05-13 LAB — HEPATIC FUNCTION PANEL
ALT: 29 U/L (ref 0–44)
AST: 40 U/L (ref 15–41)
Albumin: 4.8 g/dL (ref 3.5–5.0)
Alkaline Phosphatase: 48 U/L (ref 38–126)
Bilirubin, Direct: 0.2 mg/dL (ref 0.0–0.2)
Indirect Bilirubin: 1.7 mg/dL — ABNORMAL HIGH (ref 0.3–0.9)
Total Bilirubin: 1.9 mg/dL — ABNORMAL HIGH (ref 0.3–1.2)
Total Protein: 7.7 g/dL (ref 6.5–8.1)

## 2020-05-13 LAB — CK
Total CK: 115 U/L (ref 49–397)
Total CK: 672 U/L — ABNORMAL HIGH (ref 49–397)

## 2020-05-13 LAB — HIV ANTIBODY (ROUTINE TESTING W REFLEX): HIV Screen 4th Generation wRfx: NONREACTIVE

## 2020-05-13 LAB — SARS CORONAVIRUS 2 (TAT 6-24 HRS): SARS Coronavirus 2: NEGATIVE

## 2020-05-13 LAB — PROTIME-INR
INR: 1 (ref 0.8–1.2)
Prothrombin Time: 13 seconds (ref 11.4–15.2)

## 2020-05-13 LAB — PROTEIN / CREATININE RATIO, URINE
Creatinine, Urine: 194 mg/dL
Protein Creatinine Ratio: 0.85 mg/mg{Cre} — ABNORMAL HIGH (ref 0.00–0.15)
Total Protein, Urine: 164 mg/dL

## 2020-05-13 LAB — APTT: aPTT: 29 seconds (ref 24–36)

## 2020-05-13 LAB — MAGNESIUM: Magnesium: 2.8 mg/dL — ABNORMAL HIGH (ref 1.7–2.4)

## 2020-05-13 IMAGING — MR MR MRA HEAD W/O CM
1 series · 18 of 48 positions shown · IV contrast (gadavist)
Comparison: None.

CLINICAL DATA: Syncope with intracranial hemorrhage.

EXAM:
MRI HEAD WITHOUT AND WITH CONTRAST
MRA HEAD WITHOUT CONTRAST
MRA NECK WITHOUT AND WITH CONTRAST
TECHNIQUE: Multiplanar, multiecho pulse sequences of the brain and surrounding
structures were obtained without and with intravenous contrast.
Angiographic images of the Circle of Willis were obtained using MRA
technique without intravenous contrast. Angiographic images of the
neck were obtained using MRA technique without and with intravenous
contrast. Carotid stenosis measurements (when applicable) are
obtained utilizing NASCET criteria, using the distal internal
carotid diameter as the denominator.
CONTRAST:  8mL GADAVIST GADOBUTROL 1 MMOL/ML IV SOLN

[Series 1: TOF · axial · 0.5mm · 0.41mm/px · z∈[-60,+37]mm · 18 of 205 slices shown]
[im 1/205]
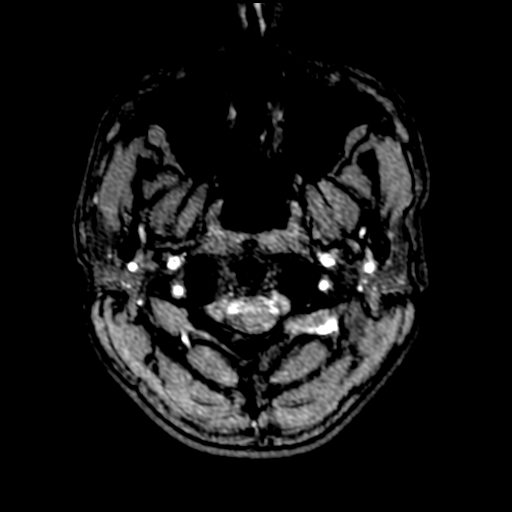
[im 5/205]
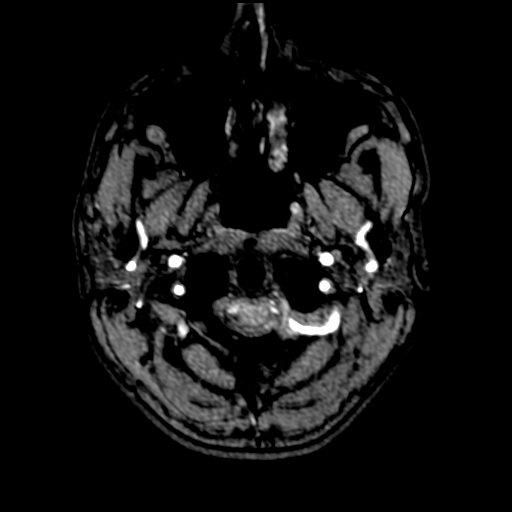
[im 9/205]
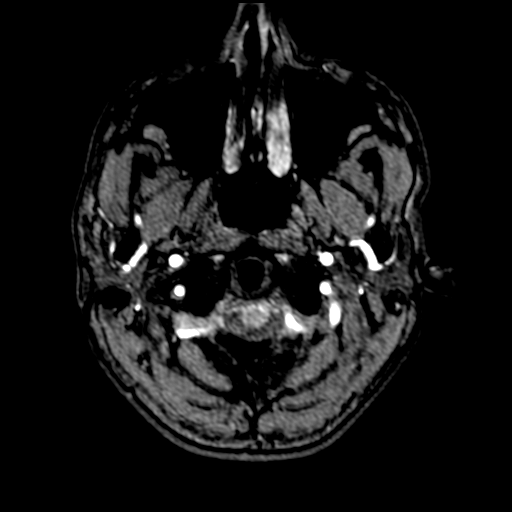
[im 14/205]
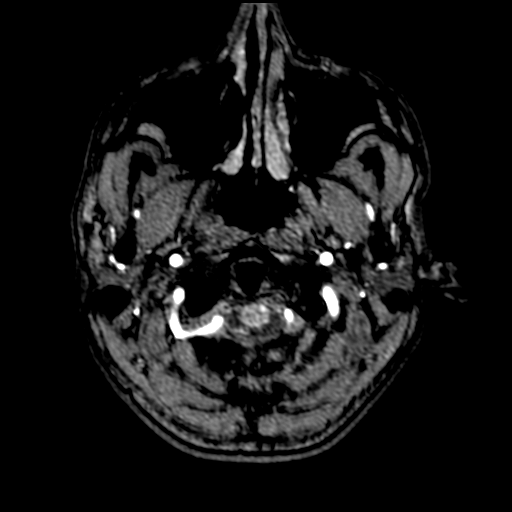
[im 18/205]
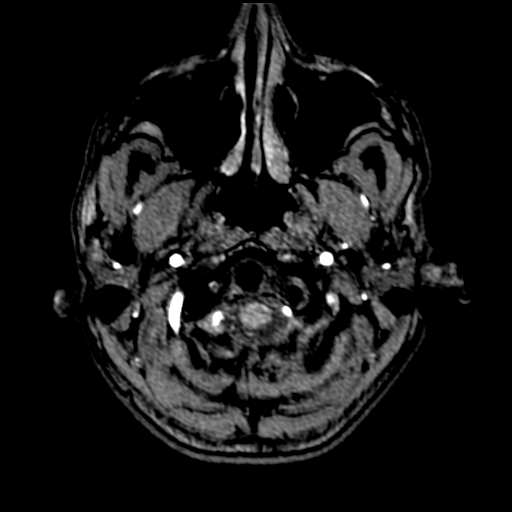
[im 22/205]
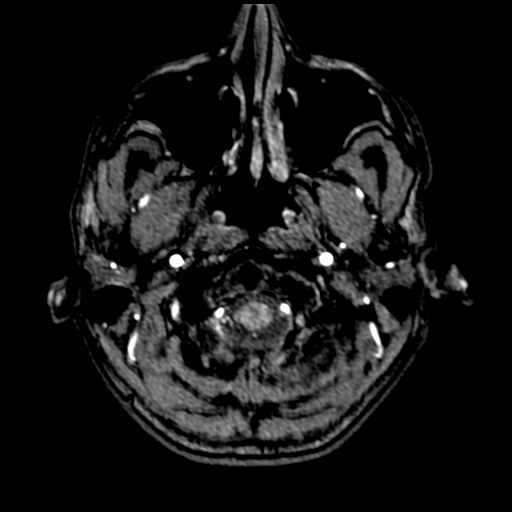
[im 27/205]
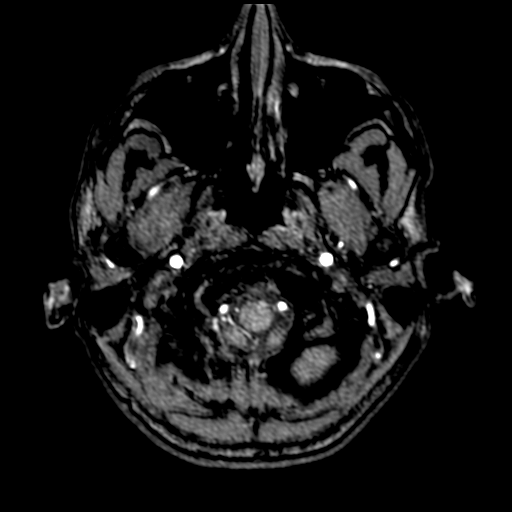
[im 31/205]
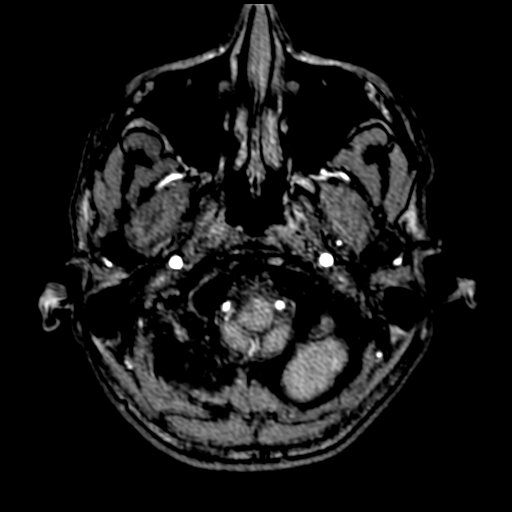
[im 35/205]
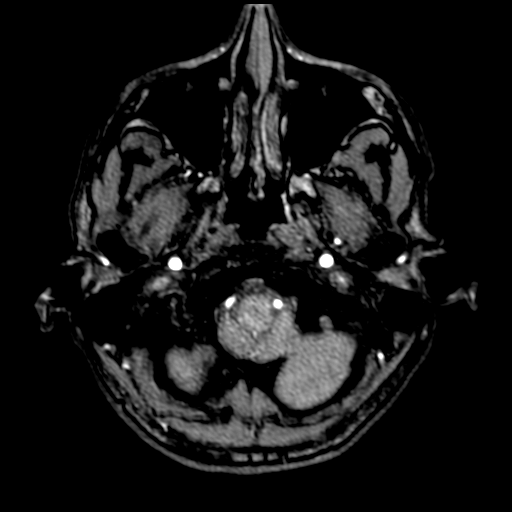
[im 40/205]
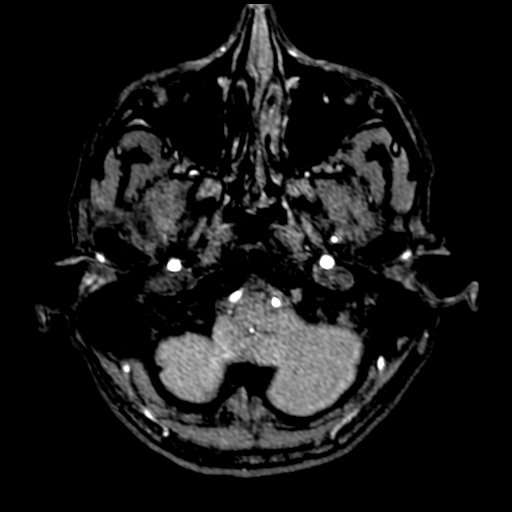
[im 66/205]
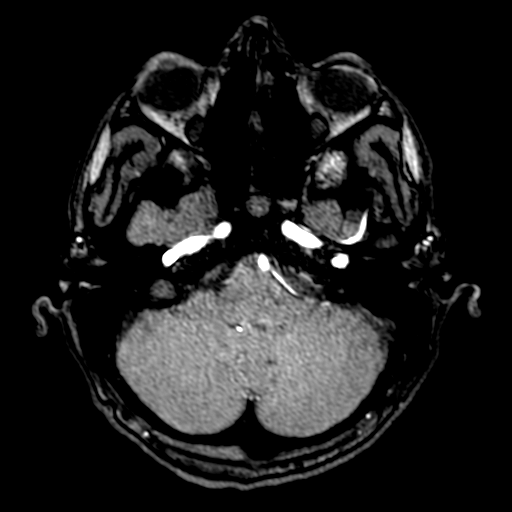
[im 92/205]
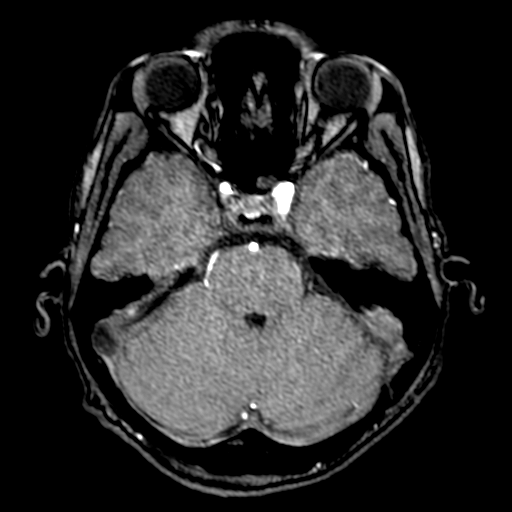
[im 105/205]
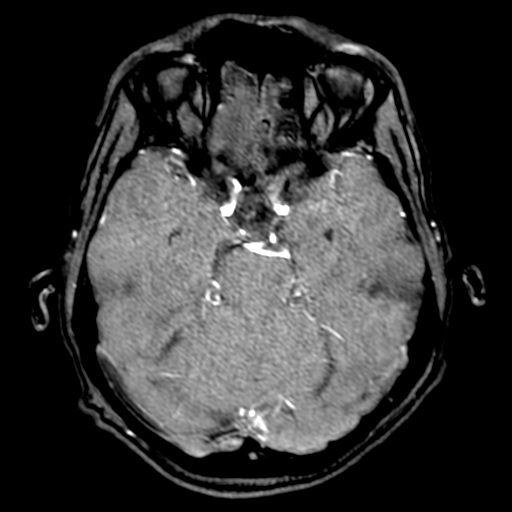
[im 118/205]
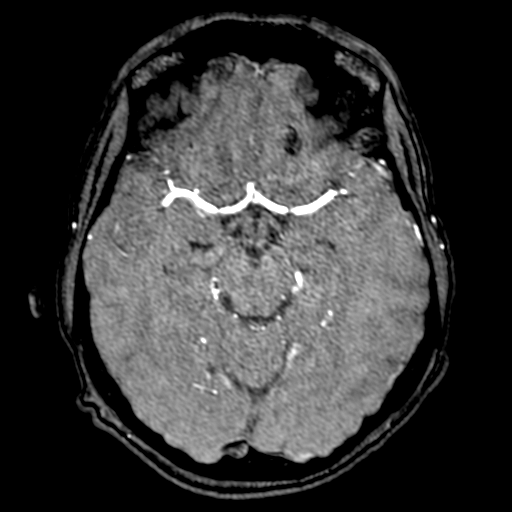
[im 144/205]
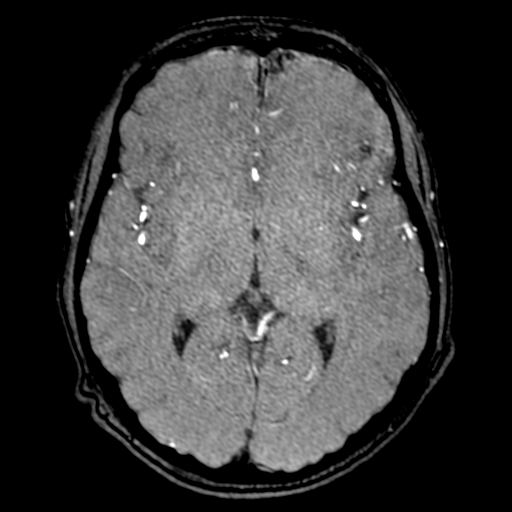
[im 170/205]
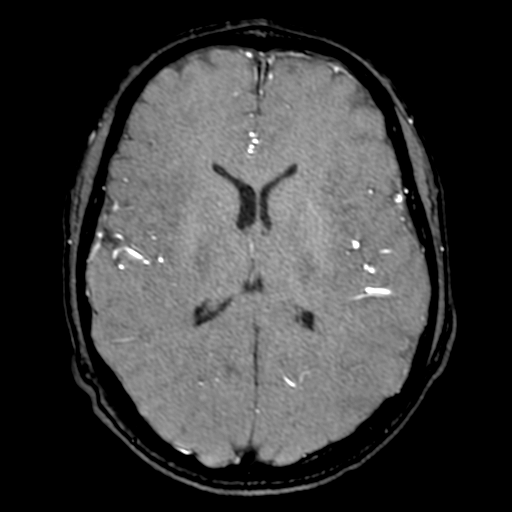
[im 174/205]
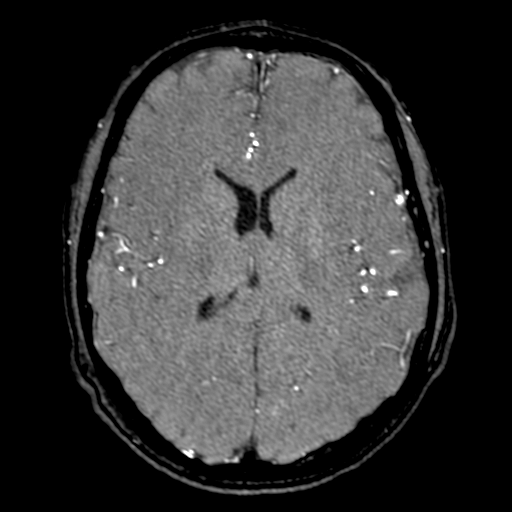
[im 196/205]
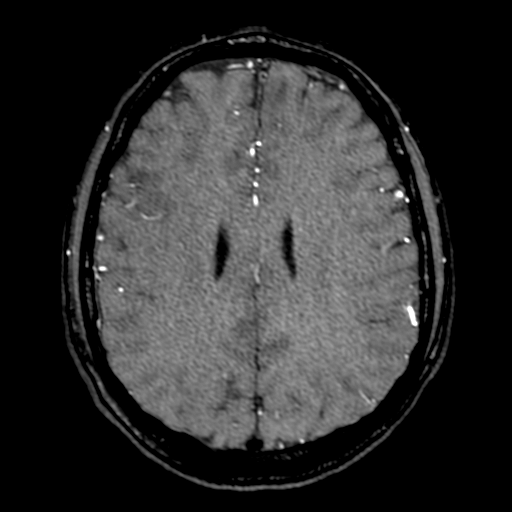

[18 of 48 positions shown; findings below may reference images not displayed]

FINDINGS: MRI HEAD FINDINGS

Brain: No acute infarct. Multifocal hemorrhagic contusion within the
anterior left temporal lobe and both anterior frontal lobes. The
most lateral component of contusion in the left frontal lobe has
increased in size. There is subdural blood over the anterior left
convexity and within the inter hemispheric fissure. No midline shift
or other mass effect. No hydrocephalus. No abnormal contrast
enhancement.

Vascular: Major flow voids are preserved.

Skull and upper cervical spine: Normal calvarium and skull base.
Visualized upper cervical spine and soft tissues are normal.

Sinuses/Orbits:No paranasal sinus fluid levels or advanced mucosal
thickening. No mastoid or middle ear effusion. Normal orbits.

MRA HEAD FINDINGS

POSTERIOR CIRCULATION:

--Vertebral arteries: Normal

--Inferior cerebellar arteries: Normal.

--Basilar artery: Normal.

--Superior cerebellar arteries: Normal.

--Posterior cerebral arteries: Normal.

ANTERIOR CIRCULATION:

--Intracranial internal carotid arteries: Normal.

--Anterior cerebral arteries (ACA): Normal.

--Middle cerebral arteries (MCA): Normal.

ANATOMIC VARIANTS: None of significance

MRA NECK FINDINGS

Normal carotid and vertebral arteries.
IMPRESSION: 1. Multifocal hemorrhagic contusion within the anterior left
temporal lobe and both anterior frontal lobes. Lateral left frontal
contusion is increased in size.
2. No midline shift or other mass effect.
3. Small volume subdural blood over the anterior left convexity and
within the inter hemispheric fissure.
4. Normal MRA of the head and neck.

## 2020-05-13 IMAGING — MR MR HEAD WO/W CM
13 series · 44 of 48 positions shown · IV contrast (gadavist)
Comparison: None.

CLINICAL DATA: Syncope with intracranial hemorrhage.

EXAM:
MRI HEAD WITHOUT AND WITH CONTRAST
MRA HEAD WITHOUT CONTRAST
MRA NECK WITHOUT AND WITH CONTRAST
TECHNIQUE: Multiplanar, multiecho pulse sequences of the brain and surrounding
structures were obtained without and with intravenous contrast.
Angiographic images of the Circle of Willis were obtained using MRA
technique without intravenous contrast. Angiographic images of the
neck were obtained using MRA technique without and with intravenous
contrast. Carotid stenosis measurements (when applicable) are
obtained utilizing NASCET criteria, using the distal internal
carotid diameter as the denominator.
CONTRAST:  8mL GADAVIST GADOBUTROL 1 MMOL/ML IV SOLN

[Series 5: ax dwi_tracew · axial · 3.0mm · 0.60mm/px · z∈[-65,+96]mm · 4 of 50 slices shown]
[im 1/50]
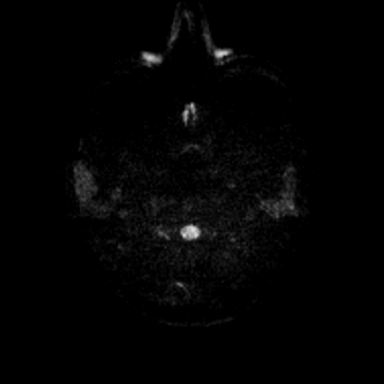
[im 17/50]
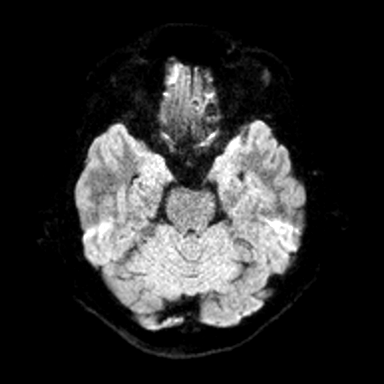
[im 33/50]
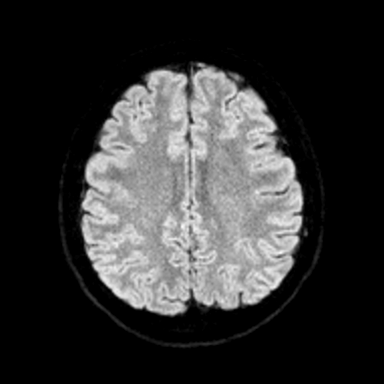
[im 50/50]
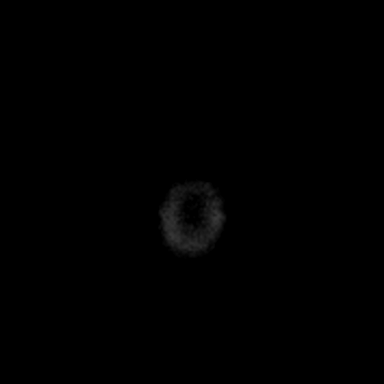

[Series 6: ax dwi_adc · axial · 3.0mm · 0.60mm/px · z∈[-65,+96]mm · 3 of 50 slices shown]
[im 1/50]
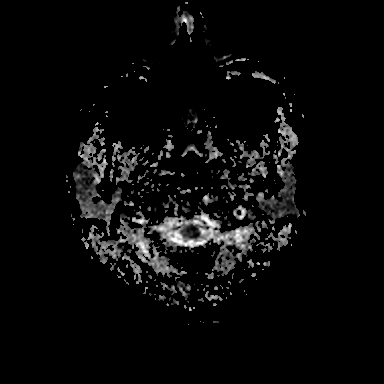
[im 25/50]
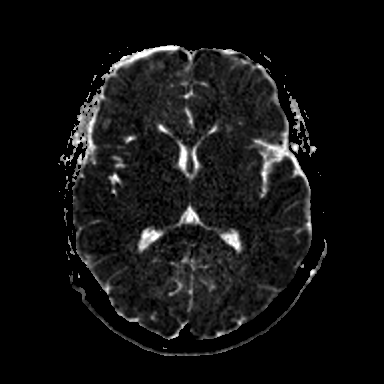
[im 50/50]
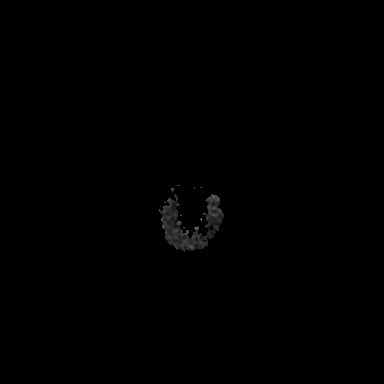

[Series 7: cor dwi_tracew · coronal · 5.0mm · 0.60mm/px · 2 of 40 slices shown]
[im 1/40]
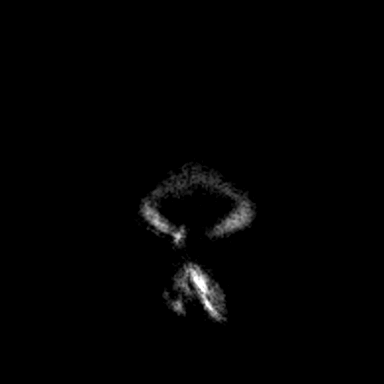
[im 40/40]
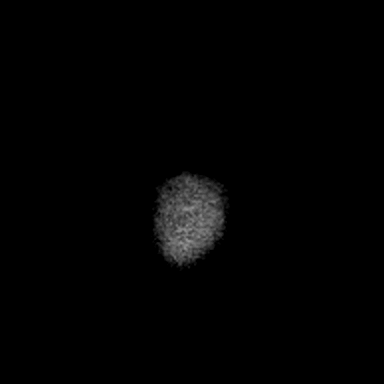

[Series 8: cor dwi_adc · coronal · 5.0mm · 0.60mm/px · 2 of 40 slices shown]
[im 1/40]
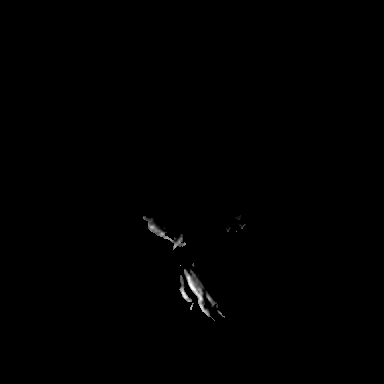
[im 40/40]
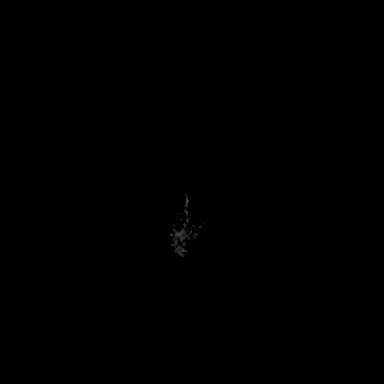

[Series 9: T1 · sagittal · 5.0mm · 0.62mm/px · 1 of 23 slices shown (1 of 2)]
[im 1/23]
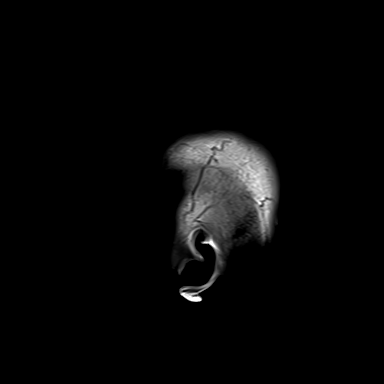

[Series 10: T2 · axial · 5.0mm · 0.53mm/px · z∈[-68,+99]mm · 2 of 29 slices shown]
[im 1/29]
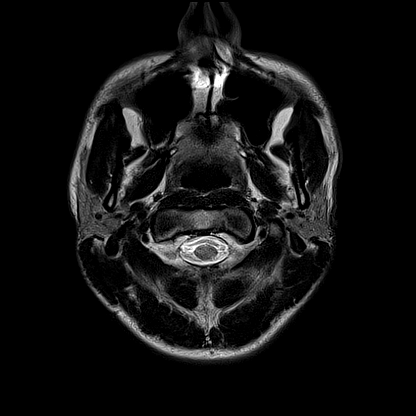
[im 29/29]
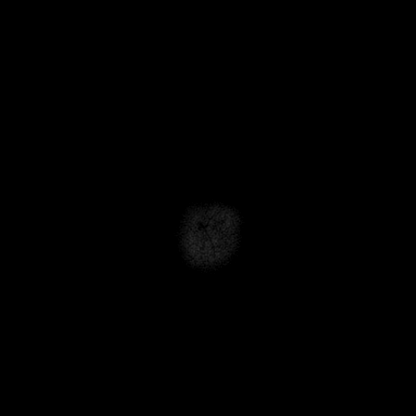

[Series 12: pha_images · axial · 3.0mm · 0.90mm/px · z∈[-73,+97]mm · 3 of 58 slices shown]
[im 1/58]
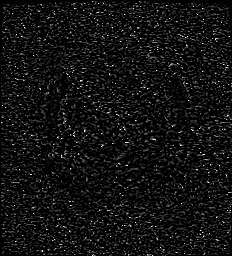
[im 29/58]
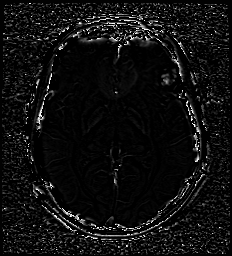
[im 58/58]
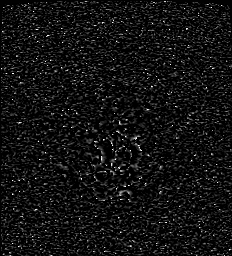

[Series 13: swi_images · axial · 3.0mm · 0.90mm/px · z∈[-73,+103]mm · 4 of 60 slices shown]
[im 1/60]
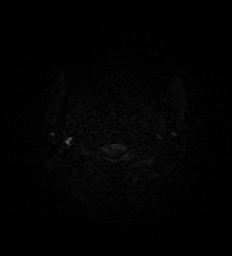
[im 20/60]
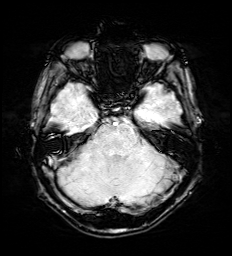
[im 40/60]
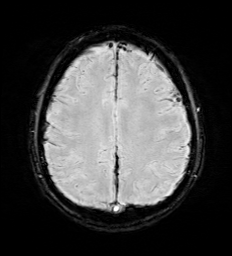
[im 60/60]
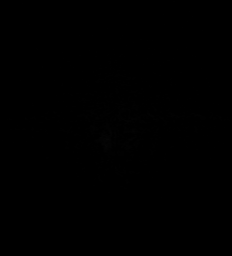

[Series 15: FLAIR · axial · 3.0mm · 0.53mm/px · z∈[-65,+96]mm · 3 of 55 slices shown]
[im 1/55]
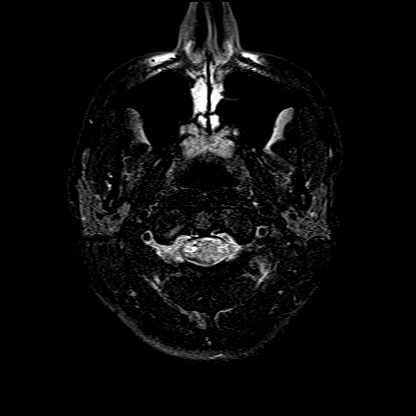
[im 28/55]
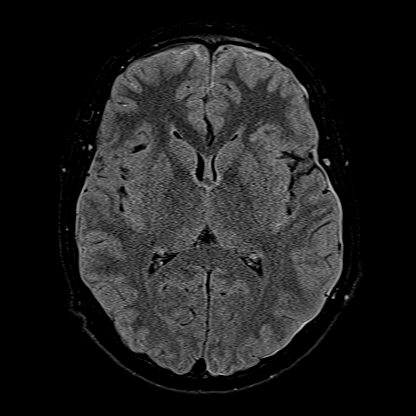
[im 55/55]
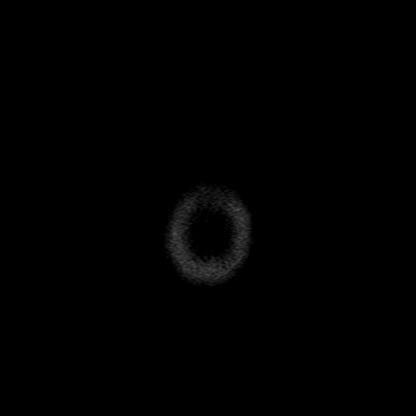

[Series 16: T1 · axial · 1.0mm · 0.98mm/px · z∈[-73,+100]mm · 8 of 175 slices shown (2 of 2)]
[im 1/175]
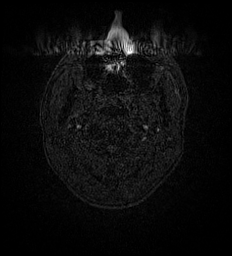
[im 20/175]
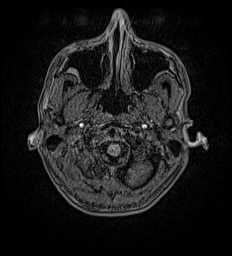
[im 59/175]
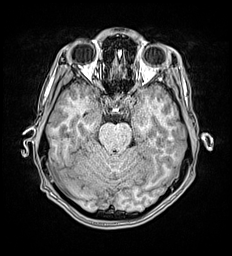
[im 78/175]
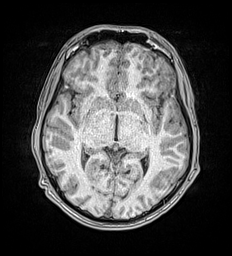
[im 97/175]
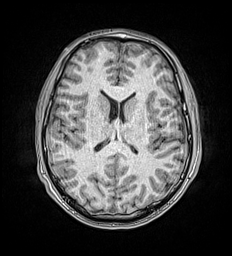
[im 117/175]
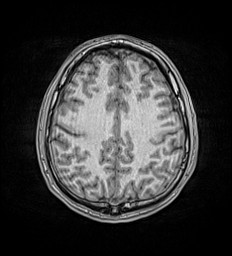
[im 155/175]
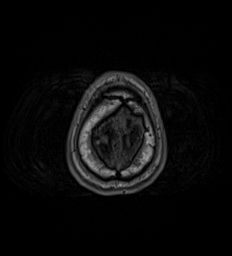
[im 175/175]
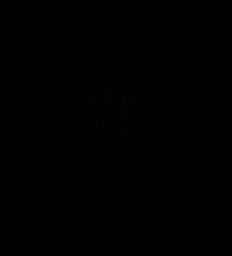

[Series 17: T2 post-contrast · coronal · 5.0mm · 0.57mm/px · 2 of 31 slices shown]
[im 1/31]
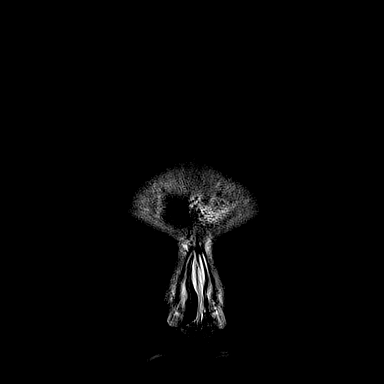
[im 31/31]
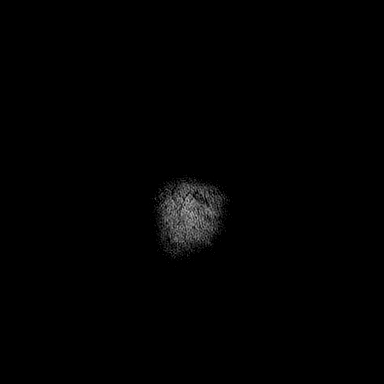

[Series 18: T1 post-contrast · axial · 1.0mm · 0.98mm/px · z∈[-73,+101]mm · 8 of 176 slices shown (1 of 2)]
[im 1/176]
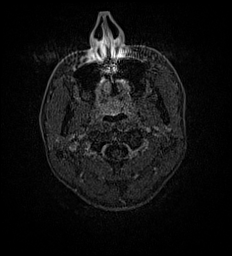
[im 20/176]
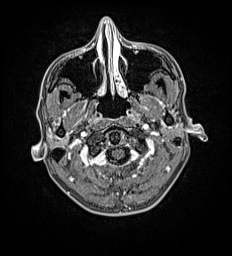
[im 59/176]
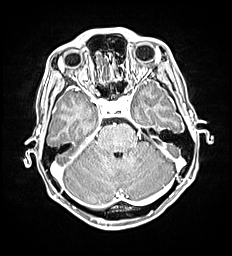
[im 78/176]
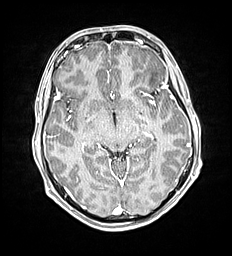
[im 98/176]
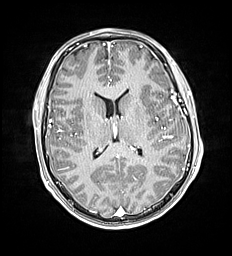
[im 117/176]
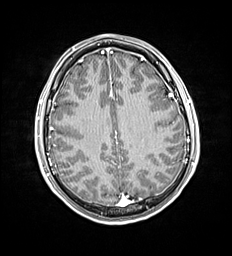
[im 156/176]
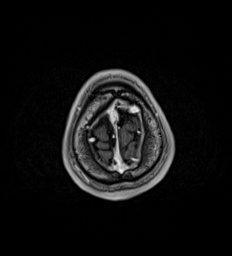
[im 176/176]
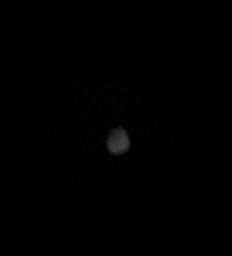

[Series 19: T1 post-contrast · coronal · 5.0mm · 0.57mm/px · 2 of 31 slices shown (2 of 2)]
[im 1/31]
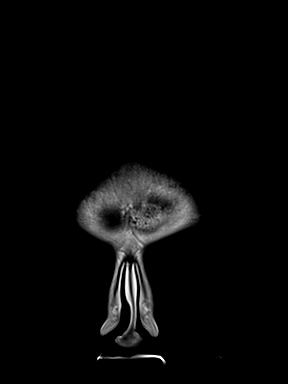
[im 31/31]
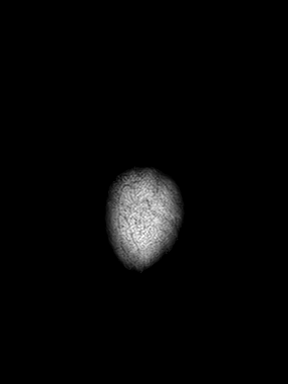

[44 of 48 positions shown; findings below may reference images not displayed]

FINDINGS: MRI HEAD FINDINGS

Brain: No acute infarct. Multifocal hemorrhagic contusion within the
anterior left temporal lobe and both anterior frontal lobes. The
most lateral component of contusion in the left frontal lobe has
increased in size. There is subdural blood over the anterior left
convexity and within the inter hemispheric fissure. No midline shift
or other mass effect. No hydrocephalus. No abnormal contrast
enhancement.

Vascular: Major flow voids are preserved.

Skull and upper cervical spine: Normal calvarium and skull base.
Visualized upper cervical spine and soft tissues are normal.

Sinuses/Orbits:No paranasal sinus fluid levels or advanced mucosal
thickening. No mastoid or middle ear effusion. Normal orbits.

MRA HEAD FINDINGS

POSTERIOR CIRCULATION:

--Vertebral arteries: Normal

--Inferior cerebellar arteries: Normal.

--Basilar artery: Normal.

--Superior cerebellar arteries: Normal.

--Posterior cerebral arteries: Normal.

ANTERIOR CIRCULATION:

--Intracranial internal carotid arteries: Normal.

--Anterior cerebral arteries (ACA): Normal.

--Middle cerebral arteries (MCA): Normal.

ANATOMIC VARIANTS: None of significance

MRA NECK FINDINGS

Normal carotid and vertebral arteries.
IMPRESSION: 1. Multifocal hemorrhagic contusion within the anterior left
temporal lobe and both anterior frontal lobes. Lateral left frontal
contusion is increased in size.
2. No midline shift or other mass effect.
3. Small volume subdural blood over the anterior left convexity and
within the inter hemispheric fissure.
4. Normal MRA of the head and neck.

## 2020-05-13 IMAGING — MR MR MRA NECK WO/W CM
4 of 6 series · 32 of 48 positions shown · IV contrast (Contrast agent)
Comparison: None.

CLINICAL DATA: Syncope with intracranial hemorrhage.

EXAM:
MRI HEAD WITHOUT AND WITH CONTRAST
MRA HEAD WITHOUT CONTRAST
MRA NECK WITHOUT AND WITH CONTRAST
TECHNIQUE: Multiplanar, multiecho pulse sequences of the brain and surrounding
structures were obtained without and with intravenous contrast.
Angiographic images of the Circle of Willis were obtained using MRA
technique without intravenous contrast. Angiographic images of the
neck were obtained using MRA technique without and with intravenous
contrast. Carotid stenosis measurements (when applicable) are
obtained utilizing NASCET criteria, using the distal internal
carotid diameter as the denominator.
CONTRAST:  8mL GADAVIST GADOBUTROL 1 MMOL/ML IV SOLN

[Series 9: angio_fl3d_cor_pre_ttc=2.0s · coronal · 0.9mm · 0.85mm/px · 9 of 96 slices shown]
[im 1/96]
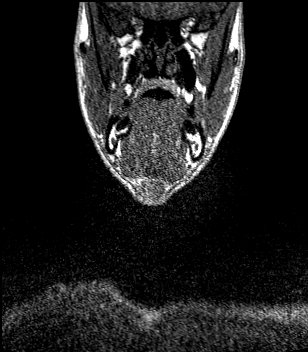
[im 12/96]
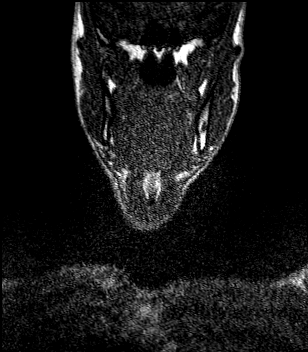
[im 24/96]
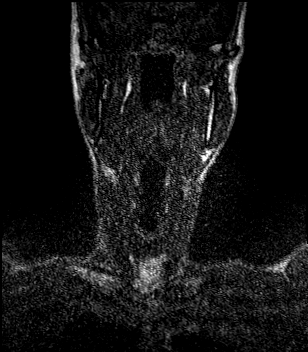
[im 36/96]
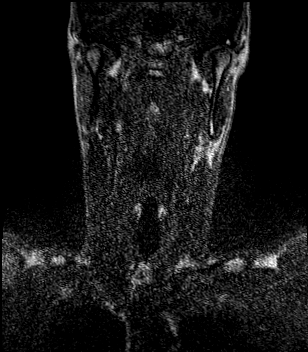
[im 48/96]
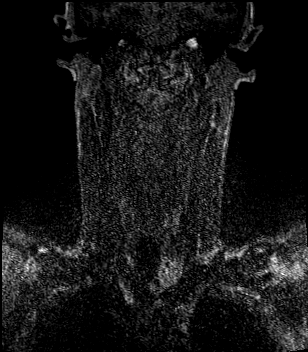
[im 60/96]
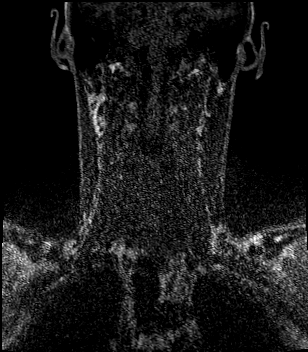
[im 72/96]
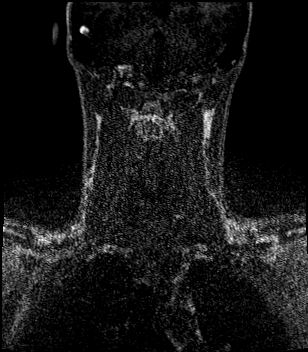
[im 84/96]
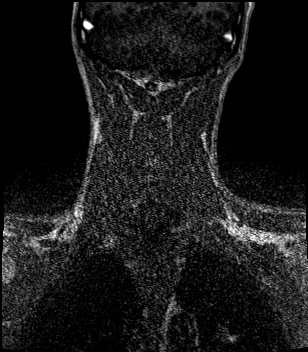
[im 96/96]
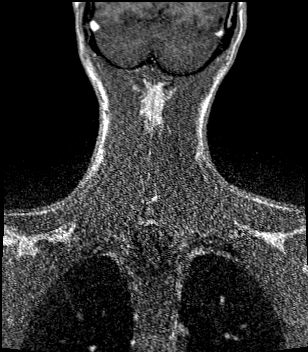

[Series 11: angio_fl3d_cor_post_ttc=2.0s · coronal · 0.9mm · 0.85mm/px · 8 of 90 slices shown]
[im 1/90]
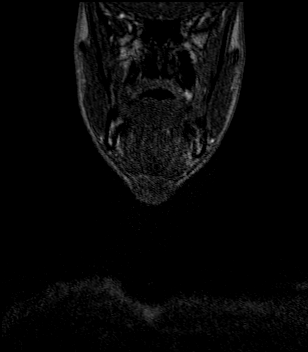
[im 13/90]
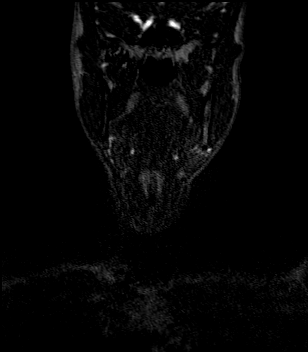
[im 26/90]
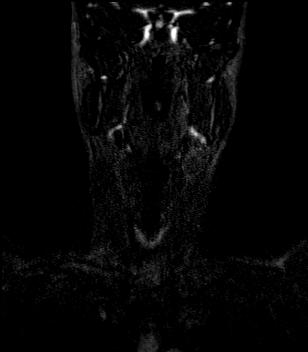
[im 39/90]
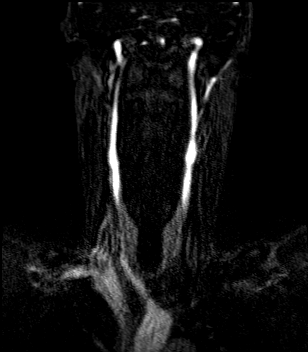
[im 51/90]
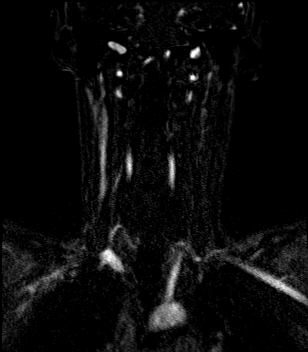
[im 64/90]
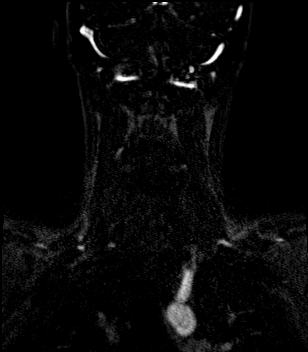
[im 77/90]
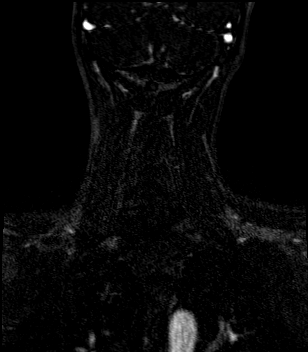
[im 90/90]
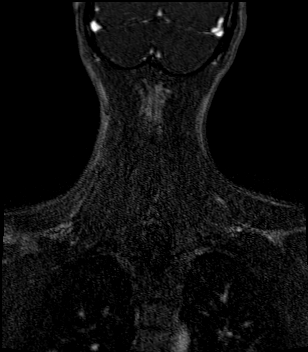

[Series 12: angio_fl3d_cor_post_ttc=2.0s_moco-adv · coronal · 0.9mm · 0.85mm/px · 8 of 88 slices shown]
[im 1/88]
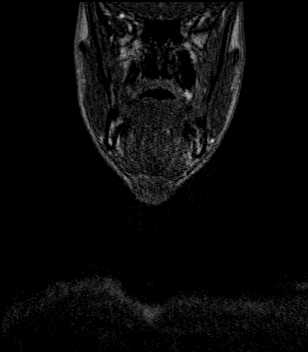
[im 13/88]
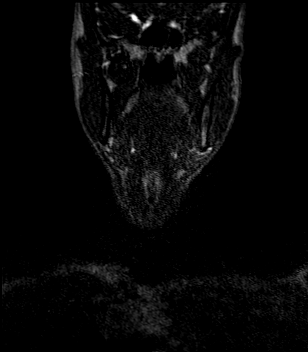
[im 25/88]
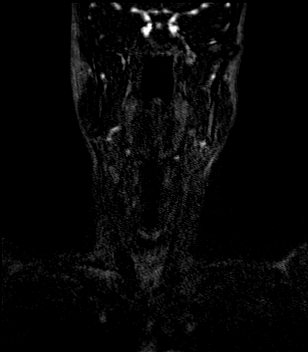
[im 38/88]
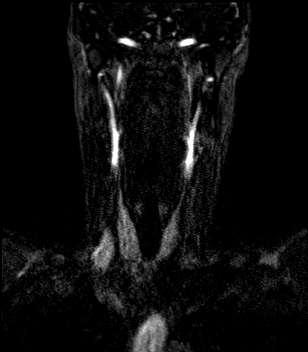
[im 50/88]
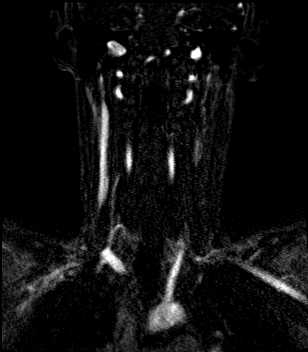
[im 63/88]
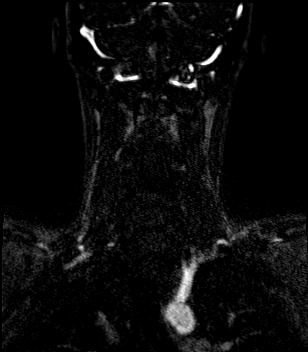
[im 75/88]
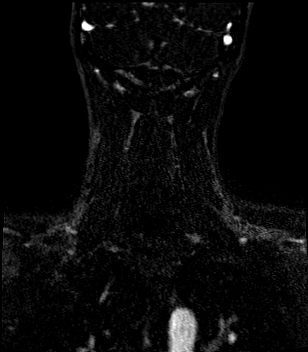
[im 88/88]
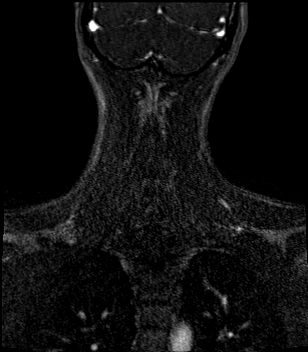

[Series 13: angio_fl3d_cor_post_ttc=2.0s_moco-adv_sub · coronal · 0.9mm · 0.85mm/px · 7 of 78 slices shown]
[im 1/78]
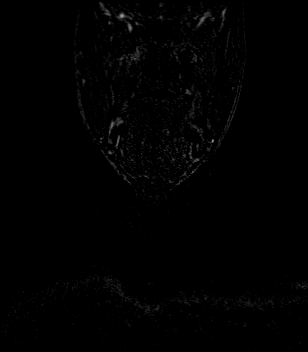
[im 13/78]
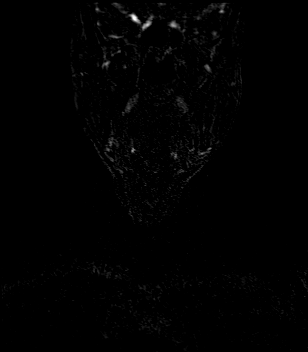
[im 26/78]
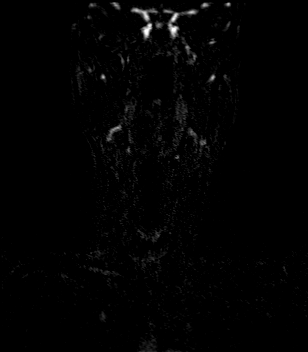
[im 39/78]
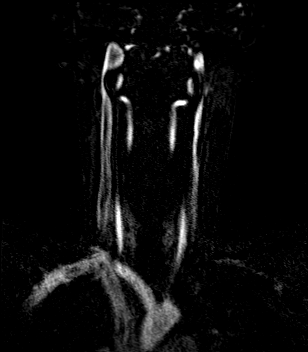
[im 52/78]
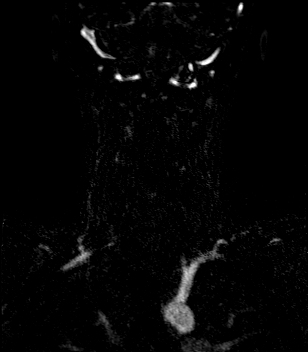
[im 65/78]
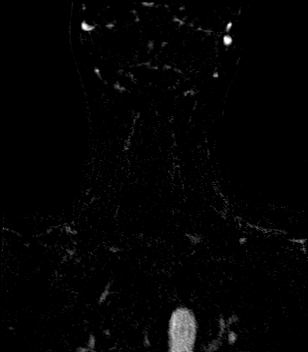
[im 78/78]
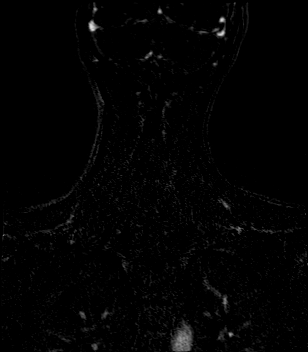

[32 of 48 positions shown; findings below may reference images not displayed]

FINDINGS: MRI HEAD FINDINGS

Brain: No acute infarct. Multifocal hemorrhagic contusion within the
anterior left temporal lobe and both anterior frontal lobes. The
most lateral component of contusion in the left frontal lobe has
increased in size. There is subdural blood over the anterior left
convexity and within the inter hemispheric fissure. No midline shift
or other mass effect. No hydrocephalus. No abnormal contrast
enhancement.

Vascular: Major flow voids are preserved.

Skull and upper cervical spine: Normal calvarium and skull base.
Visualized upper cervical spine and soft tissues are normal.

Sinuses/Orbits:No paranasal sinus fluid levels or advanced mucosal
thickening. No mastoid or middle ear effusion. Normal orbits.

MRA HEAD FINDINGS

POSTERIOR CIRCULATION:

--Vertebral arteries: Normal

--Inferior cerebellar arteries: Normal.

--Basilar artery: Normal.

--Superior cerebellar arteries: Normal.

--Posterior cerebral arteries: Normal.

ANTERIOR CIRCULATION:

--Intracranial internal carotid arteries: Normal.

--Anterior cerebral arteries (ACA): Normal.

--Middle cerebral arteries (MCA): Normal.

ANATOMIC VARIANTS: None of significance

MRA NECK FINDINGS

Normal carotid and vertebral arteries.
IMPRESSION: 1. Multifocal hemorrhagic contusion within the anterior left
temporal lobe and both anterior frontal lobes. Lateral left frontal
contusion is increased in size.
2. No midline shift or other mass effect.
3. Small volume subdural blood over the anterior left convexity and
within the inter hemispheric fissure.
4. Normal MRA of the head and neck.

## 2020-05-13 MED ORDER — LORAZEPAM 2 MG/ML IJ SOLN
1.0000 mg | Freq: Once | INTRAMUSCULAR | Status: AC
  Administered 2020-05-13: 1 mg via INTRAVENOUS

## 2020-05-13 MED ORDER — POLYETHYLENE GLYCOL 3350 17 G PO PACK
17.0000 g | PACK | Freq: Every day | ORAL | Status: DC | PRN
  Administered 2020-05-16: 11:00:00 17 g via ORAL
  Filled 2020-05-13: qty 1

## 2020-05-13 MED ORDER — LORAZEPAM 2 MG/ML IJ SOLN
1.0000 mg | Freq: Once | INTRAMUSCULAR | Status: AC
  Administered 2020-05-13: 1 mg via INTRAVENOUS
  Filled 2020-05-13: qty 1

## 2020-05-13 MED ORDER — POTASSIUM CHLORIDE CRYS ER 20 MEQ PO TBCR: 40.0000 meq | EXTENDED_RELEASE_TABLET | Freq: Once | ORAL | Status: DC

## 2020-05-13 MED ORDER — ONDANSETRON HCL 4 MG/2ML IJ SOLN
INTRAMUSCULAR | Status: AC
  Administered 2020-05-13: 4 mg via INTRAVENOUS
  Filled 2020-05-13: qty 2

## 2020-05-13 MED ORDER — ACETAMINOPHEN 325 MG PO TABS
650.0000 mg | ORAL_TABLET | Freq: Four times a day (QID) | ORAL | Status: DC | PRN
  Administered 2020-05-13 – 2020-05-14 (×3): 650 mg via ORAL
  Filled 2020-05-13 (×3): qty 2

## 2020-05-13 MED ORDER — SODIUM CHLORIDE 0.9 % IV SOLN
100.0000 mg | Freq: Two times a day (BID) | INTRAVENOUS | Status: DC
  Administered 2020-05-13: 100 mg via INTRAVENOUS
  Filled 2020-05-13 (×8): qty 10

## 2020-05-13 MED ORDER — LACTATED RINGERS IV SOLN: INTRAVENOUS | Status: DC

## 2020-05-13 MED ORDER — LACOSAMIDE 50 MG PO TABS
100.0000 mg | ORAL_TABLET | Freq: Two times a day (BID) | ORAL | Status: DC
  Administered 2020-05-14 – 2020-05-16 (×5): 100 mg via ORAL
  Filled 2020-05-13 (×6): qty 2

## 2020-05-13 MED ORDER — ACETAMINOPHEN 650 MG RE SUPP: 650.0000 mg | Freq: Four times a day (QID) | RECTAL | Status: DC | PRN

## 2020-05-13 MED ORDER — LACTATED RINGERS IV BOLUS
1000.0000 mL | Freq: Once | INTRAVENOUS | Status: AC
  Administered 2020-05-13: 1000 mL via INTRAVENOUS

## 2020-05-13 MED ORDER — LEVETIRACETAM IN NACL 500 MG/100ML IV SOLN
500.0000 mg | Freq: Two times a day (BID) | INTRAVENOUS | Status: DC
  Administered 2020-05-13: 500 mg via INTRAVENOUS
  Filled 2020-05-13 (×3): qty 100

## 2020-05-13 MED ORDER — GADOBUTROL 1 MMOL/ML IV SOLN
8.0000 mL | Freq: Once | INTRAVENOUS | Status: AC | PRN
  Administered 2020-05-13: 8 mL via INTRAVENOUS

## 2020-05-13 MED ORDER — ONDANSETRON HCL 4 MG/2ML IJ SOLN
4.0000 mg | Freq: Four times a day (QID) | INTRAMUSCULAR | Status: DC | PRN
  Administered 2020-05-13 – 2020-05-16 (×5): 4 mg via INTRAVENOUS
  Filled 2020-05-13 (×6): qty 2

## 2020-05-13 MED ORDER — ONDANSETRON HCL 4 MG PO TABS
4.0000 mg | ORAL_TABLET | Freq: Four times a day (QID) | ORAL | Status: DC | PRN
  Administered 2020-05-16: 4 mg via ORAL
  Filled 2020-05-13: qty 1

## 2020-05-13 NOTE — ED Notes (Signed)
Pharmacy contacted requesting to tube ordered Vimpat for RN to administer.

## 2020-05-13 NOTE — ED Notes (Signed)
Patient condom cath pulled off by patients movement in bed. Patient cleaned and new linens placed. New condom cath applied. Patient given warm blanket and comfortable in bed at this time

## 2020-05-13 NOTE — H&P (Signed)
History and Physical    Steven Gill ZOX:[id number] DOB: 12/14/1995 DOA: 05/12/2020  PCP: [name], Joseph, MD  Steven Gill coming from: Sent from PCP office   Chief Complaint:  Chief Complaint  Steven Gill presents with  . Loss of Consciousness  . Fall     HPI:    24 year old male with no past medical history who presents to [facility] emergency department after suffering an episode of syncope and head trauma.  Steven Gill is currently quite lethargic and confused and the majority the history has been obtained from the father who is at the bedside.  Steven Gill went for a routine [facility] care [facility] yesterday [facility].  After his appointment, he had his blood drawn and has a known history of needle phobia.  Shortly after having his blood drawn, he was walking to the clinic checkout desk when he suddenly lost consciousness, falling backwards and striking his head on the floor.  Almost immediately after the fall the Steven Gill was visibly confused and disoriented.  Steven Gill was sent to [facility] emergency department for evaluation.  In the [facility] emergency department waiting room the Steven Gill had an episode of witnessed seizure-like activity.  Upon evaluation in the emergency department, CT imaging of the head without contrast revealed a left subdural hematoma with evidence of parenchymal contusions.  Case was discussed with Dr. Adriana Simasook with neurosurgery who recommended a repeat CT head after 6 hours initially, Ativan and 1.5 g of Keppra were given per Dr. Patsey Bertholdook's recommendation.  Repeat CT head was performed revealing no increase in the size of subdural hematoma.  Neurology also evaluated the Steven Gill via telemedicine evaluation.  It is recommended that Steven Gill be hospitalized overnight for observation with further work-up including MRI and MRA of the brain with potential EEG.  The hospitalist group was then called to assess Steven Gill for mission  to the hospital.  Review of Systems:   Review of Systems  Unable to perform ROS: Mental status change  All other systems reviewed and are negative.   History reviewed. No pertinent past medical history.  History reviewed. No pertinent surgical history.   reports that he has never smoked. He has never used smokeless tobacco. He reports that he does not drink alcohol and does not use drugs.  No Known Allergies  Family History  Problem Relation Age of Onset  . Other Neg Hx      Prior to Admission medications   Medication Sig Start Date End Date Taking? Authorizing Provider  meloxicam (MOBIC) 15 MG tablet Take 1 tablet (15 mg total) by mouth daily. 07/15/15   Racheal Patchesuthriell, Jonathan D, PA-C    Physical Exam: Vitals:   05/12/20 2245 05/13/20 0215 05/13/20 0300 05/13/20 0500  BP: (!) 135/99  (!) 132/96 (!) 123/97  Pulse: 65 67 61 (!) 58  Resp: 18 16 (!) 23 (!) 27  Temp:      TempSrc:      SpO2: 98% 97% 100% 100%  Weight:      Height:        Constitutional: Steven Gill is extremely lethargic but arousable and disoriented.  Steven Gill is currently not in any acute distress.  Skin: no rashes, no lesions, poor skin turgor noted. Eyes: Pupils are equally reactive to light.  No evidence of scleral icterus or conjunctival pallor.  ENMT: Somewhat dry mucous membranes noted.  Posterior pharynx clear of any exudate or lesions.   Neck: normal, supple, no masses, no thyromegaly.  No evidence of jugular  venous distension.   Respiratory: clear to auscultation bilaterally, no wheezing, no crackles. Normal respiratory effort. No accessory muscle use.  Cardiovascular: Regular rate and rhythm, no murmurs / rubs / gallops. No extremity edema. 2+ pedal pulses. No carotid bruits.  Chest:   Nontender without crepitus or deformity.   Back:   Nontender without crepitus or deformity. Abdomen: Abdomen is soft and nontender.  No evidence of intra-abdominal masses.  Positive bowel sounds noted in all quadrants.    Musculoskeletal: No joint deformity upper and lower extremities. Good ROM, no contractures. Normal muscle tone.  Neurologic: Steven Gill is extremely lethargic but arousable.  Steven Gill is currently disoriented and confused.  Steven Gill is responsive to verbal and painful stimuli.  Steven Gill is only intermittently following commands.  Sensation seems to be grossly intact.   Psychiatric: Unable to assess due to degree of lethargy.  Steven Gill currently does not seem to possess insight as to his current situation.  Labs on Admission: I have personally reviewed following labs and imaging studies -   CBC: Recent Labs  Lab 05/12/20 1624 05/13/20 0343  WBC 12.2* 13.0*  NEUTROABS  --  11.9*  HGB 14.9 14.3  HCT 41.3 40.6  MCV 81.0 81.7  PLT 242 160   Basic Metabolic Panel: Recent Labs  Lab 05/12/20 1624 05/13/20 0343  NA 139 138  K 2.8* 4.7  CL 102 106  CO2 24 21*  GLUCOSE 165* 121*  BUN 11 17  CREATININE 1.13 2.29*  CALCIUM 9.1 9.2  MG 1.9 2.8*  PHOS 2.2*  --    GFR: Estimated Creatinine Clearance: 58.8 mL/min (A) (by C-G formula based on SCr of 2.29 mg/dL (H)). Liver Function Tests: Recent Labs  Lab 05/13/20 0343  AST 40  40  ALT 28  29  ALKPHOS 48  48  BILITOT 1.9*  1.9*  PROT 7.8  7.7  ALBUMIN 4.7  4.8   No results for input(s): LIPASE, AMYLASE in the last 168 hours. No results for input(s): AMMONIA in the last 168 hours. Coagulation Profile: Recent Labs  Lab 05/13/20 0343  INR 1.0   Cardiac Enzymes: Recent Labs  Lab 05/12/20 1624  CKTOTAL 115   BNP (last 3 results) No results for input(s): PROBNP in the last 8760 hours. HbA1C: No results for input(s): HGBA1C in the last 72 hours. CBG: No results for input(s): GLUCAP in the last 168 hours. Lipid Profile: No results for input(s): CHOL, HDL, LDLCALC, TRIG, CHOLHDL, LDLDIRECT in the last 72 hours. Thyroid Function Tests: No results for input(s): TSH, T4TOTAL, FREET4, T3FREE, THYROIDAB in the last 72  hours. Anemia Panel: No results for input(s): VITAMINB12, FOLATE, FERRITIN, TIBC, IRON, RETICCTPCT in the last 72 hours. Urine analysis:    Component Value Date/Time   COLORURINE YELLOW (A) 05/12/2020 1659   APPEARANCEUR CLEAR (A) 05/12/2020 1659   LABSPEC 1.018 05/12/2020 1659   PHURINE 5.0 05/12/2020 1659   GLUCOSEU 50 (A) 05/12/2020 1659   HGBUR SMALL (A) 05/12/2020 1659   BILIRUBINUR NEGATIVE 05/12/2020 1659   KETONESUR 5 (A) 05/12/2020 1659   PROTEINUR >=300 (A) 05/12/2020 1659   NITRITE NEGATIVE 05/12/2020 1659   LEUKOCYTESUR NEGATIVE 05/12/2020 1659    Radiological Exams on Admission - Personally Reviewed: CT Head Wo Contrast  Result Date: 05/12/2020 CLINICAL DATA:  Fall.  Re-evaluate bleed EXAM: CT HEAD WITHOUT CONTRAST CT CERVICAL SPINE WITHOUT CONTRAST TECHNIQUE: Multidetector CT imaging of the head and cervical spine was performed following the standard protocol without intravenous contrast. Multiplanar CT image reconstructions  of the cervical spine were also generated. COMPARISON:  CT head 05/12/2020 FINDINGS: CT HEAD FINDINGS Brain: Subdural hematoma again demonstrated along the left side of the falx measuring about 5 mm maximal depth. Additional parenchymal contusion demonstrated in the left inferior frontal lobe. Mild progression in the size and conspicuity of the left inferior frontal lobe hemorrhage since previous study. No new hemorrhage. No significant mass effect or midline shift. No ventricular dilatation. Gray-white matter junctions are distinct. Vascular: No acute abnormalities. Skull: Nondepressed basal skull fractures again demonstrated with fracture lines extending to the foramen magnum. Sinuses/Orbits: Air-fluid levels in the sphenoid sinus. Mastoid air cells are clear. Other: Note that the CT head was apparently reconstructed with images flipped right to left with respect to the typical image orientation. CT CERVICAL SPINE FINDINGS Alignment: Straightening of the  usual cervical lordosis and cervical scoliosis convex towards the right without anterior subluxation. This is likely positional but muscle spasm could also have this appearance. Normal alignment of the posterior elements. C1-2 articulation appears intact. Skull base and vertebrae: Right basal occipital skull fractures again demonstrated. Soft tissues and spinal canal: No prevertebral soft tissue swelling. No abnormal paraspinal soft tissue mass or infiltration. Disc levels:  Intervertebral disc space heights are preserved. Upper chest: Visualized lung apices are clear. Other: None. IMPRESSION: 1. Subdural hematoma again demonstrated along the left side of the falx measuring about 5 mm maximal depth. No change. 2. Parenchymal contusions in the left inferior frontal lobe with mild progression in size and conspicuity since previous study. No significant mass effect or midline shift. 3. Nondepressed basal skull fractures again demonstrated. 4. Nonspecific straightening of usual cervical lordosis and cervical scoliosis convex towards the right without anterior subluxation. This is likely positional but muscle spasm could also have this appearance. 5. No acute displaced fractures identified in the cervical spine. Electronically Signed   By: Burman Nieves M.D.   On: 05/12/2020 23:54   CT HEAD WO CONTRAST  Result Date: 05/12/2020 CLINICAL DATA:  Syncope.  Fall with head injury EXAM: CT HEAD WITHOUT CONTRAST TECHNIQUE: Contiguous axial images were obtained from the base of the skull through the vertex without intravenous contrast. COMPARISON:  None. FINDINGS: Brain: Acute interhemispheric subdural hematoma anteriorly measuring approximately 3 mm in thickness. Additional small area of hemorrhagic contusion in the left inferior frontal lobe measuring approximately 5 mm. No subarachnoid hemorrhage. Ventricle size normal.  Negative for acute infarct or mass lesion. Vascular: Negative for hyperdense vessel Skull: Right  occipital skull fracture, acute appearing. Nondisplaced fracture. Sinuses/Orbits: Small air-fluid level in the sphenoid sinus. No skull base fracture identified. Negative orbit Other: None IMPRESSION: 3 mm interhemispheric subdural hematoma anteriorly. Small hemorrhagic contusion left inferior frontal lobe Nondisplaced fracture right occipital bone. These results were called by telephone at the time of interpretation on 05/12/2020 at 4:50 pm to provider Katrinka Blazing, who verbally acknowledged these results. Electronically Signed   By: Marlan Palau M.D.   On: 05/12/2020 16:50   CT Cervical Spine Wo Contrast  Result Date: 05/12/2020 CLINICAL DATA:  Fall.  Re-evaluate bleed EXAM: CT HEAD WITHOUT CONTRAST CT CERVICAL SPINE WITHOUT CONTRAST TECHNIQUE: Multidetector CT imaging of the head and cervical spine was performed following the standard protocol without intravenous contrast. Multiplanar CT image reconstructions of the cervical spine were also generated. COMPARISON:  CT head 05/12/2020 FINDINGS: CT HEAD FINDINGS Brain: Subdural hematoma again demonstrated along the left side of the falx measuring about 5 mm maximal depth. Additional parenchymal contusion demonstrated in the left inferior  frontal lobe. Mild progression in the size and conspicuity of the left inferior frontal lobe hemorrhage since previous study. No new hemorrhage. No significant mass effect or midline shift. No ventricular dilatation. Gray-white matter junctions are distinct. Vascular: No acute abnormalities. Skull: Nondepressed basal skull fractures again demonstrated with fracture lines extending to the foramen magnum. Sinuses/Orbits: Air-fluid levels in the sphenoid sinus. Mastoid air cells are clear. Other: Note that the CT head was apparently reconstructed with images flipped right to left with respect to the typical image orientation. CT CERVICAL SPINE FINDINGS Alignment: Straightening of the usual cervical lordosis and cervical scoliosis  convex towards the right without anterior subluxation. This is likely positional but muscle spasm could also have this appearance. Normal alignment of the posterior elements. C1-2 articulation appears intact. Skull base and vertebrae: Right basal occipital skull fractures again demonstrated. Soft tissues and spinal canal: No prevertebral soft tissue swelling. No abnormal paraspinal soft tissue mass or infiltration. Disc levels:  Intervertebral disc space heights are preserved. Upper chest: Visualized lung apices are clear. Other: None. IMPRESSION: 1. Subdural hematoma again demonstrated along the left side of the falx measuring about 5 mm maximal depth. No change. 2. Parenchymal contusions in the left inferior frontal lobe with mild progression in size and conspicuity since previous study. No significant mass effect or midline shift. 3. Nondepressed basal skull fractures again demonstrated. 4. Nonspecific straightening of usual cervical lordosis and cervical scoliosis convex towards the right without anterior subluxation. This is likely positional but muscle spasm could also have this appearance. 5. No acute displaced fractures identified in the cervical spine. Electronically Signed   By: Burman Nieves M.D.   On: 05/12/2020 23:54   MR ANGIO HEAD WO CONTRAST  Result Date: 05/13/2020 CLINICAL DATA:  Syncope with intracranial hemorrhage. EXAM: MRI HEAD WITHOUT AND WITH CONTRAST MRA HEAD WITHOUT CONTRAST MRA NECK WITHOUT AND WITH CONTRAST TECHNIQUE: Multiplanar, multiecho pulse sequences of the brain and surrounding structures were obtained without and with intravenous contrast. Angiographic images of the Circle of Willis were obtained using MRA technique without intravenous contrast. Angiographic images of the neck were obtained using MRA technique without and with intravenous contrast. Carotid stenosis measurements (when applicable) are obtained utilizing NASCET criteria, using the distal internal carotid  diameter as the denominator. CONTRAST:  9mL GADAVIST GADOBUTROL 1 MMOL/ML IV SOLN COMPARISON:  None. FINDINGS: MRI HEAD FINDINGS Brain: No acute infarct. Multifocal hemorrhagic contusion within the anterior left temporal lobe and both anterior frontal lobes. The most lateral component of contusion in the left frontal lobe has increased in size. There is subdural blood over the anterior left convexity and within the inter hemispheric fissure. No midline shift or other mass effect. No hydrocephalus. No abnormal contrast enhancement. Vascular: Major flow voids are preserved. Skull and upper cervical spine: Normal calvarium and skull base. Visualized upper cervical spine and soft tissues are normal. Sinuses/Orbits:No paranasal sinus fluid levels or advanced mucosal thickening. No mastoid or middle ear effusion. Normal orbits. MRA HEAD FINDINGS POSTERIOR CIRCULATION: --Vertebral arteries: Normal --Inferior cerebellar arteries: Normal. --Basilar artery: Normal. --Superior cerebellar arteries: Normal. --Posterior cerebral arteries: Normal. ANTERIOR CIRCULATION: --Intracranial internal carotid arteries: Normal. --Anterior cerebral arteries (ACA): Normal. --Middle cerebral arteries (MCA): Normal. ANATOMIC VARIANTS: None of significance MRA NECK FINDINGS Normal carotid and vertebral arteries. IMPRESSION: 1. Multifocal hemorrhagic contusion within the anterior left temporal lobe and both anterior frontal lobes. Lateral left frontal contusion is increased in size. 2. No midline shift or other mass effect. 3. Small volume subdural blood  over the anterior left convexity and within the inter hemispheric fissure. 4. Normal MRA of the head and neck. Electronically Signed   By: Deatra Robinson M.D.   On: 05/13/2020 03:09   MR ANGIO NECK W WO CONTRAST  Result Date: 05/13/2020 CLINICAL DATA:  Syncope with intracranial hemorrhage. EXAM: MRI HEAD WITHOUT AND WITH CONTRAST MRA HEAD WITHOUT CONTRAST MRA NECK WITHOUT AND WITH CONTRAST  TECHNIQUE: Multiplanar, multiecho pulse sequences of the brain and surrounding structures were obtained without and with intravenous contrast. Angiographic images of the Circle of Willis were obtained using MRA technique without intravenous contrast. Angiographic images of the neck were obtained using MRA technique without and with intravenous contrast. Carotid stenosis measurements (when applicable) are obtained utilizing NASCET criteria, using the distal internal carotid diameter as the denominator. CONTRAST:  8mL GADAVIST GADOBUTROL 1 MMOL/ML IV SOLN COMPARISON:  None. FINDINGS: MRI HEAD FINDINGS Brain: No acute infarct. Multifocal hemorrhagic contusion within the anterior left temporal lobe and both anterior frontal lobes. The most lateral component of contusion in the left frontal lobe has increased in size. There is subdural blood over the anterior left convexity and within the inter hemispheric fissure. No midline shift or other mass effect. No hydrocephalus. No abnormal contrast enhancement. Vascular: Major flow voids are preserved. Skull and upper cervical spine: Normal calvarium and skull base. Visualized upper cervical spine and soft tissues are normal. Sinuses/Orbits:No paranasal sinus fluid levels or advanced mucosal thickening. No mastoid or middle ear effusion. Normal orbits. MRA HEAD FINDINGS POSTERIOR CIRCULATION: --Vertebral arteries: Normal --Inferior cerebellar arteries: Normal. --Basilar artery: Normal. --Superior cerebellar arteries: Normal. --Posterior cerebral arteries: Normal. ANTERIOR CIRCULATION: --Intracranial internal carotid arteries: Normal. --Anterior cerebral arteries (ACA): Normal. --Middle cerebral arteries (MCA): Normal. ANATOMIC VARIANTS: None of significance MRA NECK FINDINGS Normal carotid and vertebral arteries. IMPRESSION: 1. Multifocal hemorrhagic contusion within the anterior left temporal lobe and both anterior frontal lobes. Lateral left frontal contusion is increased in  size. 2. No midline shift or other mass effect. 3. Small volume subdural blood over the anterior left convexity and within the inter hemispheric fissure. 4. Normal MRA of the head and neck. Electronically Signed   By: Deatra Robinson M.D.   On: 05/13/2020 03:09   MR Brain W and Wo Contrast  Result Date: 05/13/2020 CLINICAL DATA:  Syncope with intracranial hemorrhage. EXAM: MRI HEAD WITHOUT AND WITH CONTRAST MRA HEAD WITHOUT CONTRAST MRA NECK WITHOUT AND WITH CONTRAST TECHNIQUE: Multiplanar, multiecho pulse sequences of the brain and surrounding structures were obtained without and with intravenous contrast. Angiographic images of the Circle of Willis were obtained using MRA technique without intravenous contrast. Angiographic images of the neck were obtained using MRA technique without and with intravenous contrast. Carotid stenosis measurements (when applicable) are obtained utilizing NASCET criteria, using the distal internal carotid diameter as the denominator. CONTRAST:  8mL GADAVIST GADOBUTROL 1 MMOL/ML IV SOLN COMPARISON:  None. FINDINGS: MRI HEAD FINDINGS Brain: No acute infarct. Multifocal hemorrhagic contusion within the anterior left temporal lobe and both anterior frontal lobes. The most lateral component of contusion in the left frontal lobe has increased in size. There is subdural blood over the anterior left convexity and within the inter hemispheric fissure. No midline shift or other mass effect. No hydrocephalus. No abnormal contrast enhancement. Vascular: Major flow voids are preserved. Skull and upper cervical spine: Normal calvarium and skull base. Visualized upper cervical spine and soft tissues are normal. Sinuses/Orbits:No paranasal sinus fluid levels or advanced mucosal thickening. No mastoid or middle ear effusion.  Normal orbits. MRA HEAD FINDINGS POSTERIOR CIRCULATION: --Vertebral arteries: Normal --Inferior cerebellar arteries: Normal. --Basilar artery: Normal. --Superior cerebellar  arteries: Normal. --Posterior cerebral arteries: Normal. ANTERIOR CIRCULATION: --Intracranial internal carotid arteries: Normal. --Anterior cerebral arteries (ACA): Normal. --Middle cerebral arteries (MCA): Normal. ANATOMIC VARIANTS: None of significance MRA NECK FINDINGS Normal carotid and vertebral arteries. IMPRESSION: 1. Multifocal hemorrhagic contusion within the anterior left temporal lobe and both anterior frontal lobes. Lateral left frontal contusion is increased in size. 2. No midline shift or other mass effect. 3. Small volume subdural blood over the anterior left convexity and within the inter hemispheric fissure. 4. Normal MRA of the head and neck. Electronically Signed   By: Deatra Robinson M.D.   On: 05/13/2020 03:09    EKG: Personally reviewed.  Rhythm is sinus tachycardia with heart rate of 100 bpm.  Evidence of incomplete right bundle branch block.  No dynamic ST segment changes appreciated.  Assessment/Plan Principal Problem:   Subdural hematoma (HCC)   Notable left subdural hematoma on initial noncontrast CT head without substantial increase in size on serial CT  Evidence of adjacent parenchymal contusions as well  Dr. Adriana Simas with neurosurgery has evaluated the Steven Gill and stated that there is no indication for surgical intervention.  Dr. Iver Nestle with neurology has also been gracious enough to evaluate the Steven Gill.  Considering Steven Gill's ongoing lethargy and confusion, monitoring Steven Gill overnight with serial neurologic checks and seizure precautions per neurology recommendations  Steven Gill undergoing MRI brain as well as MRA of the head and neck per neurology recommendations  Due to Steven Gill's mentation failing to quickly return to baseline we will also obtain EEG.  Close clinical monitoring in the progressive unit  Active Problems:   Seizure-like activity [facility])   Steven Gill exhibited suspected seizure activity in the emergency department waiting room  Steven Gill had been loaded  with 1.5 g of Keppra at the time  While neurology has recommended Vimpat twice daily for 1 week due to Steven Gill's tenuous oral intake at this time we will continue scheduled intravenous Keppra for now.    EEG ordered  Seizure precautions  Monitoring and correcting electrolyte abnormalities  Hydrating Steven Gill with intravenous isotonic fluids  AKI (acute kidney injury) (HCC)   Serial chemistry revealing mild acute kidney injury  Surprisingly, there is significant proteinuria on urinalysis as well  No evidence of urinary tract infection on urinalysis  Obtaining creatine kinase  Hydrating Steven Gill with intravenous isotonic fluids  Strict input and output monitoring  Serial chemistries to monitor renal function and electrolytes  Steven Gill had Foley catheter placed in the emergency department, we will discontinue this.    Hypokalemia  Replacing with potassium chloride  Monitoring potassium levels with serial chemistry    Syncope, vasovagal   Steven Gill did experience an episode of syncope shortly after having his blood drawn at his primary care provider office  Syncope was likely vasovagal considering Steven Gill's known history of needle phobia.  Alternatively, this could have been orthostatic syncope with the Steven Gill being n.p.o. for his blood work.  Monitoring Steven Gill on telemetry  Likely no further work-up needed concerning this unless this recurs.    Proteinuria    Surprising man of proteinuria noted on urinalysis  No evidence of concurrent urinary tract infection  No history of diabetes  Obtaining protein creatinine ratio    Code Status:  Full code Family Communication: Father is at bedside who has been updated on plan of care.  Status is: Observation  The Steven Gill remains OBS appropriate and will d/c  before 2 midnights.  Dispo: The Steven Gill is from: Home              Anticipated d/c is to: Home              Anticipated d/c date is: 2 days               Steven Gill currently is not medically stable to d/c.        Marinda Elk MD [facility] Pager [phone]  If 7PM-7AM, please contact night-coverage [email or web address] Use universal [place] password for that web site. If you do not have the password, please call the hospital operator.  05/13/2020, 9:26 AM

## 2020-05-13 NOTE — ED Notes (Signed)
Pt not following commands and will not lie on back. This RN notified Dr. Dolores Frame and orders for ativan went in to help patient be calm and lie on back for MRI. Medications given as documented.

## 2020-05-13 NOTE — ED Notes (Signed)
Reported to dad patient can eat

## 2020-05-13 NOTE — ED Notes (Addendum)
Pt transported to MRI with this RN and MRI tech.

## 2020-05-13 NOTE — Progress Notes (Addendum)
Neurology Progress Note  Patient ID: Steven Gill is a 24 y.o. with PMHx of  has no past medical history on file.  Initially consulted for: Post concussive seizure  Major interval events:  Overnight received Benadryl 50 mg, Haldol 2.5 mg x 2 doses Ativan 4 mg for MRI 1:40 AM - 2:20 a.m. in 1 mg increments Ativan 1 mg 8 PM 1 L lactated Ringer bolus for AKI  Subjective: Irritable, complains of headache wants to be left alone  Exam: Vitals:   05/13/20 0300 05/13/20 0500  BP: (!) 132/96 (!) 123/97  Pulse: 61 (!) 58  Resp: (!) 23 (!) 27  Temp:    SpO2: 100% 100%   Gen: In bed, comfortable  Resp: non-labored breathing, no grossly audible wheezing Cardiac: Perfusing extremities well  Abd: soft, nt  Neuro: MS: Sleepy, but awakens and follows some simple commands (thumbs up, stick out your tongue)  CN: PERRL, EOMI, face symmetric, tongue midline  Motor: Moving all extremities equally antigravity, refuses formal testing Sensory: Equally reactive to touch in all four extremities   Pertinent Labs: Cr increased from 1.13 to 2.29 K normalized to 4.7 T bili mildly elevated 1.9 magnesium mildly elevated at 2.8 repeat CK pending, initial CK was 115 U tox positive for marijuana  MRI brain personally reviewed, bifrontal contusions and left temporal contusion, with a small volume subdural hemorrhage that is stable MRA head and neck personally reviewed, agree with radiology that it is normal  Impression: 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 EEG would be indicated if patient does have further seizures, which currently there is no evidence of  - every 4-hour neuro checks - 7-day course of Vimpat 100 mg twice daily per standard guidelines for seizure prophylaxis in  traumatic ICH (avoiding Keppra due to patient's baseline anxiety and high rate of worsening phychiatric symptoms with this medication)  - If long term AED is needed in the future, lamotrigine may be considered given its mood stabilization as well as anti-seizure effects - Observation for return to baseline, potential to need rehab - PT/OT/SLP cognitive eval - No further inpatient neurological workup needed, supportive care. Neurology available on an as-needed basis going forward - Outpatient neurology follow-up on discharge  # Basilar skull fracture - Appreciate neurosurgery involvement  # AKI possibly secondary to rhabdomyolysis from seizures - CK every 12 hours, trend to peak and continue IV fluids as needed  Father extensively counseled on expected gradual return to baseline, potential to need rehab, possibility of disinhibition, memory impairments, risk of future seizures but data favoring not continuing anti-seizure medication longer than 7 days unless seizures recur in the future. Also discussed potential of lamotrigine in the future to help mood and seizures, which would need to be discussed with outpatient provider  Standard seizure counseling: Per [facility] statutes, patients with seizures are not allowed to drive until  they have been seizure-free for six months. Use caution when using heavy equipment or power tools. Avoid working on ladders or at heights. Take showers instead of baths. Ensure the water temperature is not too high on the home water heater. Do not go swimming alone. When caring for infants or small children, sit down when holding, feeding, or changing them to minimize risk of injury to the child  in the event you have a seizure.  To reduce risk of seizures, maintain good sleep hygiene avoid alcohol and illicit drug use, take all anti-seizure medications as prescribed.   Patient's marijuana use was not discussed with family given he cannot currently consent to this  disclosure. Images were reviewed with father at bedside.  Brooke Dare MD-PhD [facility] [phone]   [facility] coverage for [facility] is from 8 AM to 4 AM in-house and 4 PM to 8 PM by telephone/video. 8 PM to 8 AM emergent questions or overnight urgent questions should be addressed to Teleneurology On-call or [facility] neurohospitalist; contact information can be found on AMION   The above visit was an in person visit, addendum provided for coding purposes in response to query

## 2020-05-13 NOTE — ED Notes (Addendum)
Pt in MRI machine at this time, pt strapped in with protection of his head. Pt appears to be sleeping, not agitated, and not moving around. Pt connected to MRI monitor, this RN monitoring at this time.

## 2020-05-13 NOTE — Progress Notes (Signed)
PROGRESS NOTE    Steven Gill  WUJ:[id number] DOB: 05/13/1996 DOA: 05/12/2020 PCP: Steven Gill   Chief Complain: Fall  Brief Narrative: Patient is a 24 year old male with history of needle phobia who was in his PCPs office for regular blood draw.  He became very anxious and syncopized after the blood draw.  He became lethargic afterwards and was brought to the emergency department.  In the emergency department, he had a witnessed seizure.  Imagings of the brain showed 3 to 5 mm subdural hematoma, multifocal hemorrhage and contusion, nondepressed basilar skull fracture.  Neurosurgery, neurology consulted.  Hospital course was remarkable for development of AKI.  Assessment & Plan:   Principal Problem:   Subdural hematoma (HCC) Active Problems:   Hypokalemia   Syncope, vasovagal   Proteinuria   Seizure-like activity (HCC)   Seizure precipitated by fall: In the emergency department, he had a witnessed seizure.  Neurology following.  Neurology recommended Vimpat for 7 days course.  Follow-up as an outpatient with neurology. Continue seizure precautions.  Neurology did not recommend EEG.  Traumatic brain injury: Imagings of the brain showed 3 to 5 mm subdural hematoma, multifocal hemorrhage and contusion, nondepressed basilar skull fracture.  Neurosurgery, neurology consulted.  Hospital course was remarkable for development of AKI.  Monitor mental status. Neurology following.  EEG has been ordered.  Continue Vimpat PT/OT evaluation pending.  AKI: Continue IV fluids.  Monitor BMP.  Most likely he might have developed acute tubular necrosis from rhabdomyolysis from seizures.  Check CK.  Leukocytosis: Most likely this is reactive.  Continue to monitor  Severe hypokalemia: Supplemented and corrected.  Proteinuria: Unknown etiology or significance.  Continue to monitor.         DVT prophylaxis:SCD Code Status: Full Family Communication: Father at the bedside Status is:  Observation  The patient remains OBS appropriate and will d/c before 2 midnights.  Dispo: The patient is from: Home              Anticipated d/c is to: Home              Anticipated d/c date is: 2 days              Patient currently is not medically stable to d/c.    Consultants: Neurology, neurosurgery  Procedures: None  Antimicrobials: None Anti-infectives (From admission, onward)   None      Subjective: Patient seen and examined at the bedside this morning.  Hemodynamically stable during my evaluation.  He was sleepy and did not wake up when I called his name.  Did not look in any kind of distress.  Father at the bedside  Objective: Vitals:   05/12/20 2245 05/13/20 0215 05/13/20 0300 05/13/20 0500  BP: (!) 135/99  (!) 132/96 (!) 123/97  Pulse: 65 67 61 (!) 58  Resp: 18 16 (!) 23 (!) 27  Temp:      TempSrc:      SpO2: 98% 97% 100% 100%  Weight:      Height:        Intake/Output Summary (Last 24 hours) at 05/13/2020 0849 Last data filed at 05/13/2020 [id number] Gross per 24 hour  Intake 100 ml  Output 500 ml  Net -400 ml   Filed Weights   05/12/20 1622  Weight: 83.9 kg    Examination:  General exam: Sleeping,Not in distress,average built HEENT:PERRL,Oral mucosa moist, Ear/Nose normal on gross exam Respiratory system: Bilateral equal air entry, normal vesicular breath sounds, no wheezes  or crackles  Cardiovascular system: S1 & S2 heard, RRR. No JVD, murmurs, rubs, gallops or clicks. No pedal edema. Gastrointestinal system: Abdomen is nondistended, soft and nontender. No organomegaly or masses felt. Normal bowel sounds heard. Central nervous system: Alert and oriented. No focal neurological deficits. Extremities: No edema, no clubbing ,no cyanosis, distal peripheral pulses palpable. Skin: No rashes, lesions or ulcers,no icterus ,no pallor   Data Reviewed: I have personally reviewed following labs and imaging studies  CBC: Recent Labs  Lab 05/12/20 1624  05/13/20 0343  WBC 12.2* 13.0*  NEUTROABS  --  11.9*  HGB 14.9 14.3  HCT 41.3 40.6  MCV 81.0 81.7  PLT 242 160   Basic Metabolic Panel: Recent Labs  Lab 05/12/20 1624 05/13/20 0343  NA 139 138  K 2.8* 4.7  CL 102 106  CO2 24 21*  GLUCOSE 165* 121*  BUN 11 17  CREATININE 1.13 2.29*  CALCIUM 9.1 9.2  MG 1.9 2.8*  PHOS 2.2*  --    GFR: Estimated Creatinine Clearance: 58.8 mL/min (A) (by C-G formula based on SCr of 2.29 mg/dL (H)). Liver Function Tests: Recent Labs  Lab 05/13/20 0343  AST 40  40  ALT 28  29  ALKPHOS 48  48  BILITOT 1.9*  1.9*  PROT 7.8  7.7  ALBUMIN 4.7  4.8   No results for input(s): LIPASE, AMYLASE in the last 168 hours. No results for input(s): AMMONIA in the last 168 hours. Coagulation Profile: Recent Labs  Lab 05/13/20 0343  INR 1.0   Cardiac Enzymes: Recent Labs  Lab 05/12/20 1624  CKTOTAL 115   BNP (last 3 results) No results for input(s): PROBNP in the last 8760 hours. HbA1C: No results for input(s): HGBA1C in the last 72 hours. CBG: No results for input(s): GLUCAP in the last 168 hours. Lipid Profile: No results for input(s): CHOL, HDL, LDLCALC, TRIG, CHOLHDL, LDLDIRECT in the last 72 hours. Thyroid Function Tests: No results for input(s): TSH, T4TOTAL, FREET4, T3FREE, THYROIDAB in the last 72 hours. Anemia Panel: No results for input(s): VITAMINB12, FOLATE, FERRITIN, TIBC, IRON, RETICCTPCT in the last 72 hours. Sepsis Labs: No results for input(s): PROCALCITON, LATICACIDVEN in the last 168 hours.  No results found for this or any previous visit (from the past 240 hour(s)).       Radiology Studies: CT Head Wo Contrast  Result Date: 05/12/2020 CLINICAL DATA:  Fall.  Re-evaluate bleed EXAM: CT HEAD WITHOUT CONTRAST CT CERVICAL SPINE WITHOUT CONTRAST TECHNIQUE: Multidetector CT imaging of the head and cervical spine was performed following the standard protocol without intravenous contrast. Multiplanar CT image  reconstructions of the cervical spine were also generated. COMPARISON:  CT head 05/12/2020 FINDINGS: CT HEAD FINDINGS Brain: Subdural hematoma again demonstrated along the left side of the falx measuring about 5 mm maximal depth. Additional parenchymal contusion demonstrated in the left inferior frontal lobe. Mild progression in the size and conspicuity of the left inferior frontal lobe hemorrhage since previous study. No new hemorrhage. No significant mass effect or midline shift. No ventricular dilatation. Gray-white matter junctions are distinct. Vascular: No acute abnormalities. Skull: Nondepressed basal skull fractures again demonstrated with fracture lines extending to the foramen magnum. Sinuses/Orbits: Air-fluid levels in the sphenoid sinus. Mastoid air cells are clear. Other: Note that the CT head was apparently reconstructed with images flipped right to left with respect to the typical image orientation. CT CERVICAL SPINE FINDINGS Alignment: Straightening of the usual cervical lordosis and cervical scoliosis convex towards the  right without anterior subluxation. This is likely positional but muscle spasm could also have this appearance. Normal alignment of the posterior elements. C1-2 articulation appears intact. Skull base and vertebrae: Right basal occipital skull fractures again demonstrated. Soft tissues and spinal canal: No prevertebral soft tissue swelling. No abnormal paraspinal soft tissue mass or infiltration. Disc levels:  Intervertebral disc space heights are preserved. Upper chest: Visualized lung apices are clear. Other: None. IMPRESSION: 1. Subdural hematoma again demonstrated along the left side of the falx measuring about 5 mm maximal depth. No change. 2. Parenchymal contusions in the left inferior frontal lobe with mild progression in size and conspicuity since previous study. No significant mass effect or midline shift. 3. Nondepressed basal skull fractures again demonstrated. 4.  Nonspecific straightening of usual cervical lordosis and cervical scoliosis convex towards the right without anterior subluxation. This is likely positional but muscle spasm could also have this appearance. 5. No acute displaced fractures identified in the cervical spine. Electronically Signed   By: Burman Nieves M.D.   On: 05/12/2020 23:54   CT HEAD WO CONTRAST  Result Date: 05/12/2020 CLINICAL DATA:  Syncope.  Fall with head injury EXAM: CT HEAD WITHOUT CONTRAST TECHNIQUE: Contiguous axial images were obtained from the base of the skull through the vertex without intravenous contrast. COMPARISON:  None. FINDINGS: Brain: Acute interhemispheric subdural hematoma anteriorly measuring approximately 3 mm in thickness. Additional small area of hemorrhagic contusion in the left inferior frontal lobe measuring approximately 5 mm. No subarachnoid hemorrhage. Ventricle size normal.  Negative for acute infarct or mass lesion. Vascular: Negative for hyperdense vessel Skull: Right occipital skull fracture, acute appearing. Nondisplaced fracture. Sinuses/Orbits: Small air-fluid level in the sphenoid sinus. No skull base fracture identified. Negative orbit Other: None IMPRESSION: 3 mm interhemispheric subdural hematoma anteriorly. Small hemorrhagic contusion left inferior frontal lobe Nondisplaced fracture right occipital bone. These results were called by telephone at the time of interpretation on 05/12/2020 at 4:50 pm to provider Katrinka Blazing, who verbally acknowledged these results. Electronically Signed   By: Marlan Palau M.D.   On: 05/12/2020 16:50   CT Cervical Spine Wo Contrast  Result Date: 05/12/2020 CLINICAL DATA:  Fall.  Re-evaluate bleed EXAM: CT HEAD WITHOUT CONTRAST CT CERVICAL SPINE WITHOUT CONTRAST TECHNIQUE: Multidetector CT imaging of the head and cervical spine was performed following the standard protocol without intravenous contrast. Multiplanar CT image reconstructions of the cervical spine were  also generated. COMPARISON:  CT head 05/12/2020 FINDINGS: CT HEAD FINDINGS Brain: Subdural hematoma again demonstrated along the left side of the falx measuring about 5 mm maximal depth. Additional parenchymal contusion demonstrated in the left inferior frontal lobe. Mild progression in the size and conspicuity of the left inferior frontal lobe hemorrhage since previous study. No new hemorrhage. No significant mass effect or midline shift. No ventricular dilatation. Gray-white matter junctions are distinct. Vascular: No acute abnormalities. Skull: Nondepressed basal skull fractures again demonstrated with fracture lines extending to the foramen magnum. Sinuses/Orbits: Air-fluid levels in the sphenoid sinus. Mastoid air cells are clear. Other: Note that the CT head was apparently reconstructed with images flipped right to left with respect to the typical image orientation. CT CERVICAL SPINE FINDINGS Alignment: Straightening of the usual cervical lordosis and cervical scoliosis convex towards the right without anterior subluxation. This is likely positional but muscle spasm could also have this appearance. Normal alignment of the posterior elements. C1-2 articulation appears intact. Skull base and vertebrae: Right basal occipital skull fractures again demonstrated. Soft tissues and spinal canal:  No prevertebral soft tissue swelling. No abnormal paraspinal soft tissue mass or infiltration. Disc levels:  Intervertebral disc space heights are preserved. Upper chest: Visualized lung apices are clear. Other: None. IMPRESSION: 1. Subdural hematoma again demonstrated along the left side of the falx measuring about 5 mm maximal depth. No change. 2. Parenchymal contusions in the left inferior frontal lobe with mild progression in size and conspicuity since previous study. No significant mass effect or midline shift. 3. Nondepressed basal skull fractures again demonstrated. 4. Nonspecific straightening of usual cervical  lordosis and cervical scoliosis convex towards the right without anterior subluxation. This is likely positional but muscle spasm could also have this appearance. 5. No acute displaced fractures identified in the cervical spine. Electronically Signed   By: Burman Nieves M.D.   On: 05/12/2020 23:54   MR ANGIO HEAD WO CONTRAST  Result Date: 05/13/2020 CLINICAL DATA:  Syncope with intracranial hemorrhage. EXAM: MRI HEAD WITHOUT AND WITH CONTRAST MRA HEAD WITHOUT CONTRAST MRA NECK WITHOUT AND WITH CONTRAST TECHNIQUE: Multiplanar, multiecho pulse sequences of the brain and surrounding structures were obtained without and with intravenous contrast. Angiographic images of the Circle of Willis were obtained using MRA technique without intravenous contrast. Angiographic images of the neck were obtained using MRA technique without and with intravenous contrast. Carotid stenosis measurements (when applicable) are obtained utilizing NASCET criteria, using the distal internal carotid diameter as the denominator. CONTRAST:  29mL GADAVIST GADOBUTROL 1 MMOL/ML IV SOLN COMPARISON:  None. FINDINGS: MRI HEAD FINDINGS Brain: No acute infarct. Multifocal hemorrhagic contusion within the anterior left temporal lobe and both anterior frontal lobes. The most lateral component of contusion in the left frontal lobe has increased in size. There is subdural blood over the anterior left convexity and within the inter hemispheric fissure. No midline shift or other mass effect. No hydrocephalus. No abnormal contrast enhancement. Vascular: Major flow voids are preserved. Skull and upper cervical spine: Normal calvarium and skull base. Visualized upper cervical spine and soft tissues are normal. Sinuses/Orbits:No paranasal sinus fluid levels or advanced mucosal thickening. No mastoid or middle ear effusion. Normal orbits. MRA HEAD FINDINGS POSTERIOR CIRCULATION: --Vertebral arteries: Normal --Inferior cerebellar arteries: Normal. --Basilar  artery: Normal. --Superior cerebellar arteries: Normal. --Posterior cerebral arteries: Normal. ANTERIOR CIRCULATION: --Intracranial internal carotid arteries: Normal. --Anterior cerebral arteries (ACA): Normal. --Middle cerebral arteries (MCA): Normal. ANATOMIC VARIANTS: None of significance MRA NECK FINDINGS Normal carotid and vertebral arteries. IMPRESSION: 1. Multifocal hemorrhagic contusion within the anterior left temporal lobe and both anterior frontal lobes. Lateral left frontal contusion is increased in size. 2. No midline shift or other mass effect. 3. Small volume subdural blood over the anterior left convexity and within the inter hemispheric fissure. 4. Normal MRA of the head and neck. Electronically Signed   By: Deatra Robinson M.D.   On: 05/13/2020 03:09   MR ANGIO NECK W WO CONTRAST  Result Date: 05/13/2020 CLINICAL DATA:  Syncope with intracranial hemorrhage. EXAM: MRI HEAD WITHOUT AND WITH CONTRAST MRA HEAD WITHOUT CONTRAST MRA NECK WITHOUT AND WITH CONTRAST TECHNIQUE: Multiplanar, multiecho pulse sequences of the brain and surrounding structures were obtained without and with intravenous contrast. Angiographic images of the Circle of Willis were obtained using MRA technique without intravenous contrast. Angiographic images of the neck were obtained using MRA technique without and with intravenous contrast. Carotid stenosis measurements (when applicable) are obtained utilizing NASCET criteria, using the distal internal carotid diameter as the denominator. CONTRAST:  42mL GADAVIST GADOBUTROL 1 MMOL/ML IV SOLN COMPARISON:  None. FINDINGS:  MRI HEAD FINDINGS Brain: No acute infarct. Multifocal hemorrhagic contusion within the anterior left temporal lobe and both anterior frontal lobes. The most lateral component of contusion in the left frontal lobe has increased in size. There is subdural blood over the anterior left convexity and within the inter hemispheric fissure. No midline shift or other mass  effect. No hydrocephalus. No abnormal contrast enhancement. Vascular: Major flow voids are preserved. Skull and upper cervical spine: Normal calvarium and skull base. Visualized upper cervical spine and soft tissues are normal. Sinuses/Orbits:No paranasal sinus fluid levels or advanced mucosal thickening. No mastoid or middle ear effusion. Normal orbits. MRA HEAD FINDINGS POSTERIOR CIRCULATION: --Vertebral arteries: Normal --Inferior cerebellar arteries: Normal. --Basilar artery: Normal. --Superior cerebellar arteries: Normal. --Posterior cerebral arteries: Normal. ANTERIOR CIRCULATION: --Intracranial internal carotid arteries: Normal. --Anterior cerebral arteries (ACA): Normal. --Middle cerebral arteries (MCA): Normal. ANATOMIC VARIANTS: None of significance MRA NECK FINDINGS Normal carotid and vertebral arteries. IMPRESSION: 1. Multifocal hemorrhagic contusion within the anterior left temporal lobe and both anterior frontal lobes. Lateral left frontal contusion is increased in size. 2. No midline shift or other mass effect. 3. Small volume subdural blood over the anterior left convexity and within the inter hemispheric fissure. 4. Normal MRA of the head and neck. Electronically Signed   By: Deatra Robinson M.D.   On: 05/13/2020 03:09   MR Brain W and Wo Contrast  Result Date: 05/13/2020 CLINICAL DATA:  Syncope with intracranial hemorrhage. EXAM: MRI HEAD WITHOUT AND WITH CONTRAST MRA HEAD WITHOUT CONTRAST MRA NECK WITHOUT AND WITH CONTRAST TECHNIQUE: Multiplanar, multiecho pulse sequences of the brain and surrounding structures were obtained without and with intravenous contrast. Angiographic images of the Circle of Willis were obtained using MRA technique without intravenous contrast. Angiographic images of the neck were obtained using MRA technique without and with intravenous contrast. Carotid stenosis measurements (when applicable) are obtained utilizing NASCET criteria, using the distal internal carotid  diameter as the denominator. CONTRAST:  8mL GADAVIST GADOBUTROL 1 MMOL/ML IV SOLN COMPARISON:  None. FINDINGS: MRI HEAD FINDINGS Brain: No acute infarct. Multifocal hemorrhagic contusion within the anterior left temporal lobe and both anterior frontal lobes. The most lateral component of contusion in the left frontal lobe has increased in size. There is subdural blood over the anterior left convexity and within the inter hemispheric fissure. No midline shift or other mass effect. No hydrocephalus. No abnormal contrast enhancement. Vascular: Major flow voids are preserved. Skull and upper cervical spine: Normal calvarium and skull base. Visualized upper cervical spine and soft tissues are normal. Sinuses/Orbits:No paranasal sinus fluid levels or advanced mucosal thickening. No mastoid or middle ear effusion. Normal orbits. MRA HEAD FINDINGS POSTERIOR CIRCULATION: --Vertebral arteries: Normal --Inferior cerebellar arteries: Normal. --Basilar artery: Normal. --Superior cerebellar arteries: Normal. --Posterior cerebral arteries: Normal. ANTERIOR CIRCULATION: --Intracranial internal carotid arteries: Normal. --Anterior cerebral arteries (ACA): Normal. --Middle cerebral arteries (MCA): Normal. ANATOMIC VARIANTS: None of significance MRA NECK FINDINGS Normal carotid and vertebral arteries. IMPRESSION: 1. Multifocal hemorrhagic contusion within the anterior left temporal lobe and both anterior frontal lobes. Lateral left frontal contusion is increased in size. 2. No midline shift or other mass effect. 3. Small volume subdural blood over the anterior left convexity and within the inter hemispheric fissure. 4. Normal MRA of the head and neck. Electronically Signed   By: Deatra Robinson M.D.   On: 05/13/2020 03:09        Scheduled Meds: . lacosamide  100 mg Oral BID  . potassium chloride  40 mEq Oral  Once   Continuous Infusions: . lactated ringers 125 mL/hr at 05/13/20 0545     LOS: 0 days    Time spent: 25  mins.More than 50% of that time was spent in counseling and/or coordination of care.      Burnadette Pop, [facility] P12/31/2021, 8:49 AM

## 2020-05-13 NOTE — ED Notes (Addendum)
Patient assessed at this time with full neurological exam. Upon completion of exam RN went to hook patient back up on full cardiac monitor. Pt moving erratically with difficulty following commands. In course of movements pt inadvertently pulled IV access. RN cleaned blood from patient and bed and wrapped IV site. Patient placed in new gown and new blanket applied. Cardiac monitor put back into place and new IV started with IVF re-initiated. Patient in bed with seizure precautions in place. Bed low and locked with side rails raised x2. Call bell in reach and pt mother at bedside.

## 2020-05-14 DIAGNOSIS — R569 Unspecified convulsions: Secondary | ICD-10-CM

## 2020-05-14 DIAGNOSIS — S065X9A Traumatic subdural hemorrhage with loss of consciousness of unspecified duration, initial encounter: Principal | ICD-10-CM

## 2020-05-14 DIAGNOSIS — N179 Acute kidney failure, unspecified: Secondary | ICD-10-CM | POA: Diagnosis not present

## 2020-05-14 DIAGNOSIS — I619 Nontraumatic intracerebral hemorrhage, unspecified: Secondary | ICD-10-CM | POA: Diagnosis present

## 2020-05-14 DIAGNOSIS — W19XXXA Unspecified fall, initial encounter: Secondary | ICD-10-CM | POA: Diagnosis present

## 2020-05-14 LAB — URINALYSIS, ROUTINE W REFLEX MICROSCOPIC
Bilirubin Urine: NEGATIVE
Glucose, UA: NEGATIVE mg/dL
Ketones, ur: 5 mg/dL — AB
Leukocytes,Ua: NEGATIVE
Nitrite: NEGATIVE
Protein, ur: NEGATIVE mg/dL
RBC / HPF: >50 RBC/hpf — ABNORMAL HIGH (ref 0–5)
Specific Gravity, Urine: 1.01 (ref 1.005–1.030)
Squamous Epithelial / HPF: NONE SEEN (ref 0–5)
pH: 5 (ref 5.0–8.0)

## 2020-05-14 LAB — CBC WITH DIFFERENTIAL/PLATELET
Abs Immature Granulocytes: 0.04 10*3/uL (ref 0.00–0.07)
Basophils Absolute: 0 10*3/uL (ref 0.0–0.1)
Basophils Relative: 0 %
Eosinophils Absolute: 0 10*3/uL (ref 0.0–0.5)
Eosinophils Relative: 0 %
HCT: 32.8 % — ABNORMAL LOW (ref 39.0–52.0)
Hemoglobin: 11.8 g/dL — ABNORMAL LOW (ref 13.0–17.0)
Immature Granulocytes: 0 %
Lymphocytes Relative: 7 %
Lymphs Abs: 0.7 10*3/uL (ref 0.7–4.0)
MCH: 29.4 pg (ref 26.0–34.0)
MCHC: 36 g/dL (ref 30.0–36.0)
MCV: 81.6 fL (ref 80.0–100.0)
Monocytes Absolute: 0.8 10*3/uL (ref 0.1–1.0)
Monocytes Relative: 8 %
Neutro Abs: 8.3 10*3/uL — ABNORMAL HIGH (ref 1.7–7.7)
Neutrophils Relative %: 85 %
Platelets: 121 10*3/uL — ABNORMAL LOW (ref 150–400)
RBC: 4.02 MIL/uL — ABNORMAL LOW (ref 4.22–5.81)
RDW: 12.4 % (ref 11.5–15.5)
WBC: 9.9 10*3/uL (ref 4.0–10.5)
nRBC: 0 % (ref 0.0–0.2)

## 2020-05-14 LAB — BASIC METABOLIC PANEL
Anion gap: 9 (ref 5–15)
BUN: 23 mg/dL — ABNORMAL HIGH (ref 6–20)
CO2: 23 mmol/L (ref 22–32)
Calcium: 8.9 mg/dL (ref 8.9–10.3)
Chloride: 106 mmol/L (ref 98–111)
Creatinine, Ser: 2.51 mg/dL — ABNORMAL HIGH (ref 0.61–1.24)
GFR, Estimated: 36 mL/min — ABNORMAL LOW (ref >60)
Glucose, Bld: 113 mg/dL — ABNORMAL HIGH (ref 70–99)
Potassium: 4.2 mmol/L (ref 3.5–5.1)
Sodium: 138 mmol/L (ref 135–145)

## 2020-05-14 LAB — CK
Total CK: 1146 U/L — ABNORMAL HIGH (ref 49–397)
Total CK: 1158 U/L — ABNORMAL HIGH (ref 49–397)

## 2020-05-14 MED ORDER — LACTATED RINGERS IV BOLUS
1000.0000 mL | Freq: Once | INTRAVENOUS | Status: AC
  Administered 2020-05-14: 1000 mL via INTRAVENOUS

## 2020-05-14 NOTE — Progress Notes (Signed)
Neurology Progress Note  Patient ID: Steven Gill is a 25 y.o. with PMHx of anxiety  Initially consulted for: Post concussive seizure  Major interval events:  Worsening AKI, received fluids per primary team  Subjective: Awake interactive, having a posterior headache but much more appropriate today  Exam: Vitals:   05/14/20 0500 05/14/20 0700  BP: 130/78 120/78  Pulse: (!) 128 82  Resp: (!) 28 17  Temp:    SpO2: 98% 98%   Gen: In bed, winces with pain with movement Psychiatric: Pleasant and cooperative Resp: non-labored breathing, no grossly audible wheezing Cardiac: Perfusing extremities well  Abd: soft, nt  Neuro: MS: Slightly sleepy but able to engage with examiner and maintain attention CN: PERRL, EOMI, face symmetric, tongue midline  Motor: Able to ambulate to the bathroom, feeling generalized weakness but without any focal deficits Sensory: Equal in all 4 extremities  Pertinent Labs: Cr increased from 1.13 to 2.29 to 2.51 on 05/15/2019 Leukocytosis is resolved from 13 on admission to 9.9 today Mild anemia at hemoglobin of 11.8, likely dilutional K normalized  T bili mildly elevated 1.9 on 12/31 initial CK was 115, which then increased to 1158, now downtrending slightly to 1146 U tox positive for marijuana  MRI brain personally reviewed, bifrontal contusions and left temporal contusion, with a small volume subdural hemorrhage that is stable MRA head and neck personally reviewed, agree with radiology that it is normal  Impression: 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 Reviewed seizure precautions again with patient and family today. All questions were answered.  Recommendations:  # TBI - No need for EEG as patient is continuing to gradually improve; EEG likely to show irritability which does not necessarily indicate long term seizure risk. EEG would be indicated if patient does have further seizures, which currently there is  no evidence of  - every 4-hour neuro checks - 7-day course of Vimpat 100 mg twice daily per standard guidelines for seizure prophylaxis in traumatic ICH (avoiding Keppra due to patient's baseline anxiety and high rate of worsening phychiatric symptoms with this medication)  - If long term AED is needed in the future, lamotrigine may be considered given its mood stabilization as well as anti-seizure effects - Observation for return to baseline, potential to need rehab - PT/OT/SLP cognitive eval - No further inpatient neurological workup needed, supportive care. Neurology available on an as-needed basis going forward - Outpatient neurology follow-up on discharge, please arrange with [facility] (Dr. Sherryll Burger or Dr. Malvin Johns)  # Basilar skull fracture - Appreciate neurosurgery involvement  # AKI possibly secondary to rhabdomyolysis from seizures - CK every 12 hours, trend to peak and continue IV fluids as needed  Father extensively counseled on expected gradual return to baseline, potential to need rehab, possibility of disinhibition, memory impairments, risk of future seizures but data favoring not continuing anti-seizure medication longer than 7 days unless seizures recur in the future. Also discussed potential of lamotrigine in the future to help mood and seizures, which would need to be discussed with outpatient provider  Standard seizure counseling: Per [facility] statutes, patients with seizures are not allowed to drive until  they have been seizure-free for six months. Use caution when using heavy equipment or power tools. Avoid working on ladders or at heights. Take showers instead of baths. Ensure the water temperature is not too high on the home water heater. Do not go swimming alone. When caring for infants or small children, sit  down when holding, feeding, or changing them to minimize risk of injury to the child in the event you have a seizure.  To reduce risk of seizures, maintain  good sleep hygiene avoid alcohol and illicit drug use, take all anti-seizure medications as prescribed.   Patient's marijuana use was not discussed with family given he cannot currently consent to this disclosure. Images were reviewed with father at bedside.  Brooke Dare MD-PhD [facility] [phone]   [facility] coverage for [facility] is from 8 AM to 4 AM in-house and 4 PM to 8 PM by telephone/video. 8 PM to 8 AM emergent questions or overnight urgent questions should be addressed to Teleneurology On-call or [facility] neurohospitalist; contact information can be found on AMION

## 2020-05-14 NOTE — ED Notes (Signed)
Patient transitioned to hospital bed to promote comfort. Bed low and locked with side rails raised x3 and call bell in reach. Father at bedside. Cardiac monitor remains in place. Pt notably more alert and interactive. Breathing unlabored with symmetric chest rise and fall.

## 2020-05-14 NOTE — Evaluation (Signed)
Physical Therapy Evaluation Patient Details Name: Steven Gill MRN: [id number] DOB: April 04, 1996 Today's Date: 05/14/2020   History of Present Illness  Patient is a 25 year old male with history of needle phobia who was in his PCPs office for regular blood draw.  He became very anxious and syncopized after the blood draw.  He became lethargic afterwards and was brought to the emergency department.  In the emergency department, he had a witnessed seizure.  Imagings of the brain showed 3 to 5 mm subdural hematoma, multifocal hemorrhage and contusion, nondepressed basilar skull fracture.  Clinical Impression  Pt is a pleasant 25 year old male who was admitted for fall and subsequent subdural hematoma, hemorrhage, and skull fx. Pt demonstrates all bed mobility/transfers/ambulation at baseline level; no AD used. Educated on slow return to activity. Encouraged walking, however no vigorous activity until cleared by MD. Recommend supervision for ambulation by parent or RN while in hospital due to lines/leads. Monitored vitals with all mobility. All questions answered. Pt does not require any further PT needs at this time. Pt will be dc in house and does not require follow up. RN aware. Will dc current orders.     Follow Up Recommendations No PT follow up    Equipment Recommendations  None recommended by PT    Recommendations for Other Services       Precautions / Restrictions Precautions Precautions: Fall Restrictions Weight Bearing Restrictions: No      Mobility  Bed Mobility Overal bed mobility: Independent             General bed mobility comments: safe techniquw    Transfers Overall transfer level: Independent Equipment used: 1 person hand held assist             General transfer comment: safe technique. Slow transfer  Ambulation/Gait Ambulation/Gait assistance: Supervision Gait Distance (Feet): 200 Feet Assistive device: IV Pole Gait Pattern/deviations: Step-through  pattern     General Gait Details: ambulated in hallway. Reciprocal gait pattern with safe technique. No dizziness or headache noted. HR at 48 at rest. Increased to 64 with exertion  Stairs            Wheelchair Mobility    Modified Rankin (Stroke Patients Only)       Balance Overall balance assessment: Independent                                           Pertinent Vitals/Pain Pain Assessment: No/denies pain Faces Pain Scale: Hurts little more Pain Location: headache Pain Descriptors / Indicators: Headache Pain Intervention(s): Limited activity within patient's tolerance;Repositioned    Home Living Family/patient expects to be discharged to:: Private residence Living Arrangements: Parent Available Help at Discharge: Family;Available 24 hours/day Type of Home: House Home Access: Stairs to enter Entrance Stairs-Rails: Left Entrance Stairs-Number of Steps: 2 Home Layout: Two level;Able to live on main level with bedroom/bathroom Home Equipment: Shower seat - built in      Prior Function Level of Independence: Independent         Comments: Pt was Independent, + driving, was full-time Consulting civil engineer at [employer].     Hand Dominance   Dominant Hand: Right    Extremity/Trunk Assessment   Upper Extremity Assessment Upper Extremity Assessment: Overall WFL for tasks assessed    Lower Extremity Assessment Lower Extremity Assessment: Overall WFL for tasks assessed       Communication  Communication: No difficulties  Cognition Arousal/Alertness: Awake/alert Behavior During Therapy: WFL for tasks assessed/performed Overall Cognitive Status: Within Functional Limits for tasks assessed Area of Impairment: Rancho level               Rancho Levels of Cognitive Functioning Rancho Los Amigos Scales of Cognitive Functioning: Purposeful/appropriate               General Comments: Decreased safety awareness, Rancho Level 8      General  Comments General comments (skin integrity, edema, etc.): Reported dizziness - SITTING: BP 125/74, MAP 89, HR 54, SpO2 97% on RA    Exercises Other Exercises Other Exercises: Pt and family educated re: OT role, DME recs, d/c recs, falls prevention, ECS, TBI education, home/routines modifications, pet mgmt Other Exercises: LBD, grooming, sup<>sit, sit<>stand, sitting/standing balance/tolerance   Assessment/Plan    PT Assessment Patent does not need any further PT services  PT Problem List         PT Treatment Interventions      PT Goals (Current goals can be found in the Care Plan section)  Acute Rehab PT Goals Patient Stated Goal: To return to school PT Goal Formulation: With patient Time For Goal Achievement: 05/14/20 Potential to Achieve Goals: Good    Frequency     Barriers to discharge        Co-evaluation               AM-PAC PT "6 Clicks" Mobility  Outcome Measure Help needed turning from your back to your side while in a flat bed without using bedrails?: None Help needed moving from lying on your back to sitting on the side of a flat bed without using bedrails?: None Help needed moving to and from a bed to a chair (including a wheelchair)?: None Help needed standing up from a chair using your arms (e.g., wheelchair or bedside chair)?: None Help needed to walk in hospital room?: None Help needed climbing 3-5 steps with a railing? : None 6 Click Score: 24    End of Session   Activity Tolerance: Patient tolerated treatment well Patient left: in bed;with family/visitor present Nurse Communication: Mobility status PT Visit Diagnosis: History of falling (Z91.81)    Time: 1320-1340 PT Time Calculation (min) (ACUTE ONLY): 20 min   Charges:   PT Evaluation $PT Eval Low Complexity: 1 Low PT Treatments $Gait Training: 8-22 mins        Elizabeth Palau, PT, DPT [phone]   Jeorge Reister 05/14/2020, 4:04 PM

## 2020-05-14 NOTE — Progress Notes (Signed)
PROGRESS NOTE    Steven Gill  ZOX:[id number] DOB: 05-17-1995 DOA: 05/12/2020 PCP: Ronnette Juniper, MD   Chief Complain: Fall  Brief Narrative: Patient is a 25 year old male with history of needle phobia who was in his PCPs office for regular blood draw.  He became very anxious and syncopized after the blood draw.  He became lethargic afterwards and was brought to the emergency department.  In the emergency department, he had a witnessed seizure.  Imagings of the brain showed 3 to 5 mm subdural hematoma, multifocal hemorrhage and contusion, nondepressed basilar skull fracture.  Neurosurgery, neurology consulted.  Hospital course was remarkable for development of AKI.  His overall status is improved but AKI has persisted.  Assessment & Plan:   Principal Problem:   Subdural hematoma (HCC) Active Problems:   Hypokalemia   Syncope, vasovagal   Proteinuria   Seizure-like activity (HCC)   AKI (acute kidney injury) (HCC)   ICH (intracerebral hemorrhage) (HCC)   Seizure precipitated by fall: In the emergency department, he had a witnessed seizure.  Neurology following.  Neurology recommended Vimpat for 7 days course.  Follow-up as an outpatient with neurology. Continue seizure precautions.  Neurology did not recommend EEG.  Traumatic brain injury: Imagings of the brain showed 3 to 5 mm subdural hematoma, multifocal hemorrhage and contusion, nondepressed basilar skull fracture.  Neurosurgery, neurology consulted.  Hospital course was remarkable for development of AKI.  Monitor mental status.  Currently alert and oriented. Neurology was following.  Recommended to continue Vimpat for 7 days PT/OT evaluation pending.  AKI: Continue IV fluids.  Monitor BMP.  Most likely he might have developed acute tubular necrosis from rhabdomyolysis from seizures. Elevated  CK.  Expect improvement with fluids.  Leukocytosis: Most likely this is reactive.Resolved  Severe hypokalemia: Supplemented and  corrected.          DVT prophylaxis:SCD Code Status: Full Family Communication: Father at the bedside Status is: Inpatient    Dispo: The patient is from: Home              Anticipated d/c is to: Home              Anticipated d/c date is:1 days              Patient currently is not medically stable to d/c.    Consultants: Neurology, neurosurgery  Procedures: None  Antimicrobials: None Anti-infectives (From admission, onward)   None      Subjective:  Patient seen and examined the bedside this morning.  Hemodynamically stable.  Lying on the bed but alert and awake when called his name.  Denies any complaints.  Objective: Vitals:   05/14/20 0400 05/14/20 0500 05/14/20 0700 05/14/20 0800  BP: 134/87 130/78 120/78 124/65  Pulse: (!) 46 (!) 128 82   Resp: 15 (!) 28 17 19   Temp:      TempSrc:      SpO2: 98% 98% 98% 98%  Weight:      Height:        Intake/Output Summary (Last 24 hours) at 05/14/2020 0853 Last data filed at 05/14/2020 0438 Gross per 24 hour  Intake 1000 ml  Output 500 ml  Net 500 ml   Filed Weights   05/12/20 1622  Weight: 83.9 kg    Examination:  General exam: Appears calm and comfortable ,Not in distress,average built HEENT:PERRL,Oral mucosa moist, Ear/Nose normal on gross exam Respiratory system: Bilateral equal air entry, normal vesicular breath sounds, no wheezes or crackles  Cardiovascular system: S1 & S2 heard, RRR. No JVD, murmurs, rubs, gallops or clicks. Gastrointestinal system: Abdomen is nondistended, soft and nontender. No organomegaly or masses felt. Normal bowel sounds heard. Central nervous system: Alert and oriented. No focal neurological deficits. Extremities: No edema, no clubbing ,no cyanosis Skin: No rashes, lesions or ulcers,no icterus ,no pallor    Data Reviewed: I have personally reviewed following labs and imaging studies  CBC: Recent Labs  Lab 05/12/20 1624 05/13/20 0343 05/14/20 0436  WBC 12.2* 13.0* 9.9   NEUTROABS  --  11.9* 8.3*  HGB 14.9 14.3 11.8*  HCT 41.3 40.6 32.8*  MCV 81.0 81.7 81.6  PLT 242 160 121*   Basic Metabolic Panel: Recent Labs  Lab 05/12/20 1624 05/13/20 0343 05/14/20 0436  NA 139 138 138  K 2.8* 4.7 4.2  CL 102 106 106  CO2 24 21* 23  GLUCOSE 165* 121* 113*  BUN 11 17 23*  CREATININE 1.13 2.29* 2.51*  CALCIUM 9.1 9.2 8.9  MG 1.9 2.8*  --   PHOS 2.2*  --   --    GFR: Estimated Creatinine Clearance: 53.7 mL/min (A) (by C-G formula based on SCr of 2.51 mg/dL (H)). Liver Function Tests: Recent Labs  Lab 05/13/20 0343  AST 40  40  ALT 28  29  ALKPHOS 48  48  BILITOT 1.9*  1.9*  PROT 7.8  7.7  ALBUMIN 4.7  4.8   No results for input(s): LIPASE, AMYLASE in the last 168 hours. No results for input(s): AMMONIA in the last 168 hours. Coagulation Profile: Recent Labs  Lab 05/13/20 0343  INR 1.0   Cardiac Enzymes: Recent Labs  Lab 05/12/20 1624 05/13/20 0343 05/14/20 0024 05/14/20 0436  CKTOTAL 115 672* 1,158* 1,146*   BNP (last 3 results) No results for input(s): PROBNP in the last 8760 hours. HbA1C: No results for input(s): HGBA1C in the last 72 hours. CBG: No results for input(s): GLUCAP in the last 168 hours. Lipid Profile: No results for input(s): CHOL, HDL, LDLCALC, TRIG, CHOLHDL, LDLDIRECT in the last 72 hours. Thyroid Function Tests: No results for input(s): TSH, T4TOTAL, FREET4, T3FREE, THYROIDAB in the last 72 hours. Anemia Panel: No results for input(s): VITAMINB12, FOLATE, FERRITIN, TIBC, IRON, RETICCTPCT in the last 72 hours. Sepsis Labs: No results for input(s): PROCALCITON, LATICACIDVEN in the last 168 hours.  Recent Results (from the past 240 hour(s))  SARS CORONAVIRUS 2 (TAT 6-24 HRS) Nasopharyngeal Nasopharyngeal Swab     Status: None   Collection Time: 05/12/20 11:42 PM   Specimen: Nasopharyngeal Swab  Result Value Ref Range Status   SARS Coronavirus 2 NEGATIVE NEGATIVE Final    Comment: (NOTE) SARS-CoV-2  target nucleic acids are NOT DETECTED.  The SARS-CoV-2 RNA is generally detectable in upper and lower respiratory specimens during the acute phase of infection. Negative results do not preclude SARS-CoV-2 infection, do not rule out co-infections with other pathogens, and should not be used as the sole basis for treatment or other patient management decisions. Negative results must be combined with clinical observations, patient history, and epidemiological information. The expected result is Negative.  Fact Sheet for Patients: HairSlick.no  Fact Sheet for Healthcare Providers: quierodirigir.com  This test is not yet approved or cleared by the [place] FDA and  has been authorized for detection and/or diagnosis of SARS-CoV-2 by FDA under an Emergency Use Authorization (EUA). This EUA will remain  in effect (meaning this test can be used) for the duration of the COVID-19 declaration  under Se ction 564(b)(1) of the Act, 21 U.S.C. section 360bbb-3(b)(1), unless the authorization is terminated or revoked sooner.  Performed at [facility], [address], [place], [place] [address]          Radiology Studies: CT Head Wo Contrast  Result Date: 05/12/2020 CLINICAL DATA:  Fall.  Re-evaluate bleed EXAM: CT HEAD WITHOUT CONTRAST CT CERVICAL SPINE WITHOUT CONTRAST TECHNIQUE: Multidetector CT imaging of the head and cervical spine was performed following the standard protocol without intravenous contrast. Multiplanar CT image reconstructions of the cervical spine were also generated. COMPARISON:  CT head 05/12/2020 FINDINGS: CT HEAD FINDINGS Brain: Subdural hematoma again demonstrated along the left side of the falx measuring about 5 mm maximal depth. Additional parenchymal contusion demonstrated in the left inferior frontal lobe. Mild progression in the size and conspicuity of the left inferior frontal lobe hemorrhage since  previous study. No new hemorrhage. No significant mass effect or midline shift. No ventricular dilatation. Gray-white matter junctions are distinct. Vascular: No acute abnormalities. Skull: Nondepressed basal skull fractures again demonstrated with fracture lines extending to the foramen magnum. Sinuses/Orbits: Air-fluid levels in the sphenoid sinus. Mastoid air cells are clear. Other: Note that the CT head was apparently reconstructed with images flipped right to left with respect to the typical image orientation. CT CERVICAL SPINE FINDINGS Alignment: Straightening of the usual cervical lordosis and cervical scoliosis convex towards the right without anterior subluxation. This is likely positional but muscle spasm could also have this appearance. Normal alignment of the posterior elements. C1-2 articulation appears intact. Skull base and vertebrae: Right basal occipital skull fractures again demonstrated. Soft tissues and spinal canal: No prevertebral soft tissue swelling. No abnormal paraspinal soft tissue mass or infiltration. Disc levels:  Intervertebral disc space heights are preserved. Upper chest: Visualized lung apices are clear. Other: None. IMPRESSION: 1. Subdural hematoma again demonstrated along the left side of the falx measuring about 5 mm maximal depth. No change. 2. Parenchymal contusions in the left inferior frontal lobe with mild progression in size and conspicuity since previous study. No significant mass effect or midline shift. 3. Nondepressed basal skull fractures again demonstrated. 4. Nonspecific straightening of usual cervical lordosis and cervical scoliosis convex towards the right without anterior subluxation. This is likely positional but muscle spasm could also have this appearance. 5. No acute displaced fractures identified in the cervical spine. Electronically Signed   By: Burman Nieves M.D.   On: 05/12/2020 23:54   CT HEAD WO CONTRAST  Result Date: 05/12/2020 CLINICAL DATA:   Syncope.  Fall with head injury EXAM: CT HEAD WITHOUT CONTRAST TECHNIQUE: Contiguous axial images were obtained from the base of the skull through the vertex without intravenous contrast. COMPARISON:  None. FINDINGS: Brain: Acute interhemispheric subdural hematoma anteriorly measuring approximately 3 mm in thickness. Additional small area of hemorrhagic contusion in the left inferior frontal lobe measuring approximately 5 mm. No subarachnoid hemorrhage. Ventricle size normal.  Negative for acute infarct or mass lesion. Vascular: Negative for hyperdense vessel Skull: Right occipital skull fracture, acute appearing. Nondisplaced fracture. Sinuses/Orbits: Small air-fluid level in the sphenoid sinus. No skull base fracture identified. Negative orbit Other: None IMPRESSION: 3 mm interhemispheric subdural hematoma anteriorly. Small hemorrhagic contusion left inferior frontal lobe Nondisplaced fracture right occipital bone. These results were called by telephone at the time of interpretation on 05/12/2020 at 4:50 pm to provider Katrinka Blazing, who verbally acknowledged these results. Electronically Signed   By: Marlan Palau M.D.   On: 05/12/2020 16:50  CT Cervical Spine Wo Contrast  Result Date: 05/12/2020 CLINICAL DATA:  Fall.  Re-evaluate bleed EXAM: CT HEAD WITHOUT CONTRAST CT CERVICAL SPINE WITHOUT CONTRAST TECHNIQUE: Multidetector CT imaging of the head and cervical spine was performed following the standard protocol without intravenous contrast. Multiplanar CT image reconstructions of the cervical spine were also generated. COMPARISON:  CT head 05/12/2020 FINDINGS: CT HEAD FINDINGS Brain: Subdural hematoma again demonstrated along the left side of the falx measuring about 5 mm maximal depth. Additional parenchymal contusion demonstrated in the left inferior frontal lobe. Mild progression in the size and conspicuity of the left inferior frontal lobe hemorrhage since previous study. No new hemorrhage. No significant  mass effect or midline shift. No ventricular dilatation. Gray-white matter junctions are distinct. Vascular: No acute abnormalities. Skull: Nondepressed basal skull fractures again demonstrated with fracture lines extending to the foramen magnum. Sinuses/Orbits: Air-fluid levels in the sphenoid sinus. Mastoid air cells are clear. Other: Note that the CT head was apparently reconstructed with images flipped right to left with respect to the typical image orientation. CT CERVICAL SPINE FINDINGS Alignment: Straightening of the usual cervical lordosis and cervical scoliosis convex towards the right without anterior subluxation. This is likely positional but muscle spasm could also have this appearance. Normal alignment of the posterior elements. C1-2 articulation appears intact. Skull base and vertebrae: Right basal occipital skull fractures again demonstrated. Soft tissues and spinal canal: No prevertebral soft tissue swelling. No abnormal paraspinal soft tissue mass or infiltration. Disc levels:  Intervertebral disc space heights are preserved. Upper chest: Visualized lung apices are clear. Other: None. IMPRESSION: 1. Subdural hematoma again demonstrated along the left side of the falx measuring about 5 mm maximal depth. No change. 2. Parenchymal contusions in the left inferior frontal lobe with mild progression in size and conspicuity since previous study. No significant mass effect or midline shift. 3. Nondepressed basal skull fractures again demonstrated. 4. Nonspecific straightening of usual cervical lordosis and cervical scoliosis convex towards the right without anterior subluxation. This is likely positional but muscle spasm could also have this appearance. 5. No acute displaced fractures identified in the cervical spine. Electronically Signed   By: Burman NievesWilliam  Stevens M.D.   On: 05/12/2020 23:54   MR ANGIO HEAD WO CONTRAST  Result Date: 05/13/2020 CLINICAL DATA:  Syncope with intracranial hemorrhage. EXAM:  MRI HEAD WITHOUT AND WITH CONTRAST MRA HEAD WITHOUT CONTRAST MRA NECK WITHOUT AND WITH CONTRAST TECHNIQUE: Multiplanar, multiecho pulse sequences of the brain and surrounding structures were obtained without and with intravenous contrast. Angiographic images of the Circle of Willis were obtained using MRA technique without intravenous contrast. Angiographic images of the neck were obtained using MRA technique without and with intravenous contrast. Carotid stenosis measurements (when applicable) are obtained utilizing NASCET criteria, using the distal internal carotid diameter as the denominator. CONTRAST:  8mL GADAVIST GADOBUTROL 1 MMOL/ML IV SOLN COMPARISON:  None. FINDINGS: MRI HEAD FINDINGS Brain: No acute infarct. Multifocal hemorrhagic contusion within the anterior left temporal lobe and both anterior frontal lobes. The most lateral component of contusion in the left frontal lobe has increased in size. There is subdural blood over the anterior left convexity and within the inter hemispheric fissure. No midline shift or other mass effect. No hydrocephalus. No abnormal contrast enhancement. Vascular: Major flow voids are preserved. Skull and upper cervical spine: Normal calvarium and skull base. Visualized upper cervical spine and soft tissues are normal. Sinuses/Orbits:No paranasal sinus fluid levels or advanced mucosal thickening. No mastoid or middle ear effusion. Normal  orbits. MRA HEAD FINDINGS POSTERIOR CIRCULATION: --Vertebral arteries: Normal --Inferior cerebellar arteries: Normal. --Basilar artery: Normal. --Superior cerebellar arteries: Normal. --Posterior cerebral arteries: Normal. ANTERIOR CIRCULATION: --Intracranial internal carotid arteries: Normal. --Anterior cerebral arteries (ACA): Normal. --Middle cerebral arteries (MCA): Normal. ANATOMIC VARIANTS: None of significance MRA NECK FINDINGS Normal carotid and vertebral arteries. IMPRESSION: 1. Multifocal hemorrhagic contusion within the anterior  left temporal lobe and both anterior frontal lobes. Lateral left frontal contusion is increased in size. 2. No midline shift or other mass effect. 3. Small volume subdural blood over the anterior left convexity and within the inter hemispheric fissure. 4. Normal MRA of the head and neck. Electronically Signed   By: Deatra Robinson M.D.   On: 05/13/2020 03:09   MR ANGIO NECK W WO CONTRAST  Result Date: 05/13/2020 CLINICAL DATA:  Syncope with intracranial hemorrhage. EXAM: MRI HEAD WITHOUT AND WITH CONTRAST MRA HEAD WITHOUT CONTRAST MRA NECK WITHOUT AND WITH CONTRAST TECHNIQUE: Multiplanar, multiecho pulse sequences of the brain and surrounding structures were obtained without and with intravenous contrast. Angiographic images of the Circle of Willis were obtained using MRA technique without intravenous contrast. Angiographic images of the neck were obtained using MRA technique without and with intravenous contrast. Carotid stenosis measurements (when applicable) are obtained utilizing NASCET criteria, using the distal internal carotid diameter as the denominator. CONTRAST:  22mL GADAVIST GADOBUTROL 1 MMOL/ML IV SOLN COMPARISON:  None. FINDINGS: MRI HEAD FINDINGS Brain: No acute infarct. Multifocal hemorrhagic contusion within the anterior left temporal lobe and both anterior frontal lobes. The most lateral component of contusion in the left frontal lobe has increased in size. There is subdural blood over the anterior left convexity and within the inter hemispheric fissure. No midline shift or other mass effect. No hydrocephalus. No abnormal contrast enhancement. Vascular: Major flow voids are preserved. Skull and upper cervical spine: Normal calvarium and skull base. Visualized upper cervical spine and soft tissues are normal. Sinuses/Orbits:No paranasal sinus fluid levels or advanced mucosal thickening. No mastoid or middle ear effusion. Normal orbits. MRA HEAD FINDINGS POSTERIOR CIRCULATION: --Vertebral arteries:  Normal --Inferior cerebellar arteries: Normal. --Basilar artery: Normal. --Superior cerebellar arteries: Normal. --Posterior cerebral arteries: Normal. ANTERIOR CIRCULATION: --Intracranial internal carotid arteries: Normal. --Anterior cerebral arteries (ACA): Normal. --Middle cerebral arteries (MCA): Normal. ANATOMIC VARIANTS: None of significance MRA NECK FINDINGS Normal carotid and vertebral arteries. IMPRESSION: 1. Multifocal hemorrhagic contusion within the anterior left temporal lobe and both anterior frontal lobes. Lateral left frontal contusion is increased in size. 2. No midline shift or other mass effect. 3. Small volume subdural blood over the anterior left convexity and within the inter hemispheric fissure. 4. Normal MRA of the head and neck. Electronically Signed   By: Deatra Robinson M.D.   On: 05/13/2020 03:09   MR Brain W and Wo Contrast  Result Date: 05/13/2020 CLINICAL DATA:  Syncope with intracranial hemorrhage. EXAM: MRI HEAD WITHOUT AND WITH CONTRAST MRA HEAD WITHOUT CONTRAST MRA NECK WITHOUT AND WITH CONTRAST TECHNIQUE: Multiplanar, multiecho pulse sequences of the brain and surrounding structures were obtained without and with intravenous contrast. Angiographic images of the Circle of Willis were obtained using MRA technique without intravenous contrast. Angiographic images of the neck were obtained using MRA technique without and with intravenous contrast. Carotid stenosis measurements (when applicable) are obtained utilizing NASCET criteria, using the distal internal carotid diameter as the denominator. CONTRAST:  63mL GADAVIST GADOBUTROL 1 MMOL/ML IV SOLN COMPARISON:  None. FINDINGS: MRI HEAD FINDINGS Brain: No acute infarct. Multifocal hemorrhagic contusion within the anterior  left temporal lobe and both anterior frontal lobes. The most lateral component of contusion in the left frontal lobe has increased in size. There is subdural blood over the anterior left convexity and within the  inter hemispheric fissure. No midline shift or other mass effect. No hydrocephalus. No abnormal contrast enhancement. Vascular: Major flow voids are preserved. Skull and upper cervical spine: Normal calvarium and skull base. Visualized upper cervical spine and soft tissues are normal. Sinuses/Orbits:No paranasal sinus fluid levels or advanced mucosal thickening. No mastoid or middle ear effusion. Normal orbits. MRA HEAD FINDINGS POSTERIOR CIRCULATION: --Vertebral arteries: Normal --Inferior cerebellar arteries: Normal. --Basilar artery: Normal. --Superior cerebellar arteries: Normal. --Posterior cerebral arteries: Normal. ANTERIOR CIRCULATION: --Intracranial internal carotid arteries: Normal. --Anterior cerebral arteries (ACA): Normal. --Middle cerebral arteries (MCA): Normal. ANATOMIC VARIANTS: None of significance MRA NECK FINDINGS Normal carotid and vertebral arteries. IMPRESSION: 1. Multifocal hemorrhagic contusion within the anterior left temporal lobe and both anterior frontal lobes. Lateral left frontal contusion is increased in size. 2. No midline shift or other mass effect. 3. Small volume subdural blood over the anterior left convexity and within the inter hemispheric fissure. 4. Normal MRA of the head and neck. Electronically Signed   By: Ulyses Jarred M.D.   On: 05/13/2020 03:09        Scheduled Meds: . lacosamide  100 mg Oral Q12H  . potassium chloride  40 mEq Oral Once   Continuous Infusions: . lacosamide (VIMPAT) IV Stopped (05/13/20 2330)  . lactated ringers 150 mL/hr at 05/14/20 0626     LOS: 1 day    Time spent: 25 mins.More than 50% of that time was spent in counseling and/or coordination of care.      Shelly Coss, MD [facility] P1/05/2020, 8:53 AM

## 2020-05-14 NOTE — Progress Notes (Signed)
pt had vomit episode maybe 100 ml, 20 min after getting Vimpat. did not see pills in emeses paged MD adhikari. administered Zofran

## 2020-05-14 NOTE — ED Notes (Signed)
Patient and mother informed of need for urine specimen at next void.

## 2020-05-14 NOTE — Evaluation (Addendum)
Occupational Therapy Evaluation Patient Details Name: Steven Gill MRN: [id number] DOB: 07-16-1995 Today's Date: 05/14/2020    History of Present Illness Patient is a 25 year old male with history of needle phobia who was in his PCPs office for regular blood draw.  He became very anxious and syncopized after the blood draw.  He became lethargic afterwards and was brought to the emergency department.  In the emergency department, he had a witnessed seizure.  Imagings of the brain showed 3 to 5 mm subdural hematoma, multifocal hemorrhage and contusion, nondepressed basilar skull fracture.   Clinical Impression   Pt was seen for OT evaluation this date. Prior to hospital admission, pt was full time student at [employer] and Independent, including driving. Pt lives c parents in home c bed/baths on main level and father can be home 24/7. Pt presents to acute OT demonstrating impaired ADL performance and functional mobility 2/2 decreased safety awareness and functional balance/endurance deficits.   Upon arrival pt reclined in bed c father at bedside. Pt A&O x4 and requires increased time for attention/problem solving. Pt currently requires SBA + HHA or R rail for toilet t/f. SBA + VCs for standing grooming tasks - increased time for [facility] Pt c/o increased headache with head movements (bending over towards sink). Pt would benefit from skilled OT to address noted impairments and functional limitations (see below for any additional details) in order to maximize safety and independence while minimizing falls risk and caregiver burden. Upon hospital discharge, recommend OPOT to maximize pt safety and return to functional independence during meaningful occupations of daily life.     Follow Up Recommendations  Outpatient OT;Supervision/Assistance - 24 hour    Equipment Recommendations  None recommended by OT    Recommendations for Other Services       Precautions / Restrictions Precautions Precautions:  Fall Restrictions Weight Bearing Restrictions: No      Mobility Bed Mobility Overal bed mobility: Modified Independent             General bed mobility comments: moves slowly 2/2 headache/dizziness    Transfers Overall transfer level: Modified independent Equipment used: 1 person hand held assist             General transfer comment: Rail or HHA for safe t/fs    Balance Overall balance assessment: Mild deficits observed, not formally tested                                         ADL either performed or assessed with clinical judgement   ADL Overall ADL's : Needs assistance/impaired                                       General ADL Comments: SBA for toilet t/f and standing grooming tasks. Required increased time for [facility]                  Pertinent Vitals/Pain Pain Assessment: Faces Faces Pain Scale: Hurts little more Pain Location: headache Pain Descriptors / Indicators: Headache Pain Intervention(s): Limited activity within patient's tolerance;Repositioned     Hand Dominance Right   Extremity/Trunk Assessment Upper Extremity Assessment Upper Extremity Assessment: Overall WFL for tasks assessed   Lower Extremity Assessment Lower Extremity Assessment: Overall WFL for tasks assessed       Communication Communication Communication:  No difficulties   Cognition Arousal/Alertness: Awake/alert Behavior During Therapy: WFL for tasks assessed/performed Overall Cognitive Status: Within Functional Limits for tasks assessed Area of Impairment: Rancho level               Rancho Levels of Cognitive Functioning Rancho Los Amigos Scales of Cognitive Functioning: Purposeful/appropriate               General Comments: Decreased safety awareness, Rancho Level 8   General Comments  Reported dizziness - SITTING: BP 125/74, MAP 89, HR 54, SpO2 97% on RA    Exercises Exercises: Other exercises Other  Exercises Other Exercises: Pt and family educated re: OT role, DME recs, d/c recs, falls prevention, ECS, TBI education, home/routines modifications, pet mgmt Other Exercises: LBD, grooming, sup<>sit, sit<>stand, sitting/standing balance/tolerance   Shoulder Instructions      Home Living Family/patient expects to be discharged to:: Private residence Living Arrangements: Parent Available Help at Discharge: Family;Available 24 hours/day Type of Home: House Home Access: Stairs to enter [employer] of Steps: 2 Entrance Stairs-Rails: Left Home Layout: Two level;Able to live on main level with bedroom/bathroom (bonus room upstairs, does not use)     Bathroom Shower/Tub: Walk-in shower;Tub/shower unit         Home Equipment: Shower seat - built in          Prior Functioning/Environment Level of Independence: Independent        Comments: Pt was Independent, + driving, was full-time Consulting civil engineer at [employer].        OT Problem List: Decreased activity tolerance;Impaired balance (sitting and/or standing);Decreased coordination;Decreased safety awareness      OT Treatment/Interventions: Self-care/ADL training;Therapeutic exercise;Energy conservation;DME and/or AE instruction;Therapeutic activities;Cognitive remediation/compensation;Visual/perceptual remediation/compensation;Patient/family education;Balance training    OT Goals(Current goals can be found in the care plan section) Acute Rehab OT Goals Patient Stated Goal: To return to school OT Goal Formulation: With patient/family Time For Goal Achievement: 05/28/20 Potential to Achieve Goals: Good ADL Goals Pt Will Transfer to Toilet: Independently;ambulating;regular height toilet Additional ADL Goal #1: Pt will Independentaly verbalize x3 falls prevention strategies Additional ADL Goal #2: Pt will preapre a 5 step meal c SUPERVISION  OT Frequency: Min 1X/week    AM-PAC OT "6 Clicks" Daily Activity     Outcome Measure  Help from another person eating meals?: None Help from another person taking care of personal grooming?: A Little Help from another person toileting, which includes using toliet, bedpan, or urinal?: A Little Help from another person bathing (including washing, rinsing, drying)?: A Little Help from another person to put on and taking off regular upper body clothing?: None Help from another person to put on and taking off regular lower body clothing?: None 6 Click Score: 21   End of Session Nurse Communication: Mobility status  Activity Tolerance: Patient tolerated treatment well Patient left: in bed;with call bell/phone within reach;with nursing/sitter in room;with family/visitor present  OT Visit Diagnosis: Other abnormalities of gait and mobility (R26.89);Other symptoms and signs involving cognitive function                Time: 1127-1204 OT Time Calculation (min): 37 min Charges:  OT General Charges $OT Visit: 1 Visit OT Evaluation $OT Eval Moderate Complexity: 1 Mod OT Treatments $Self Care/Home Management : 23-37 mins  Kathie Dike, M.S. OTR/L  05/14/20, 2:28 PM  ascom [phone]

## 2020-05-14 NOTE — ED Notes (Signed)
NP Jon Billings notified of pt CK levels. Awaiting orders and response.

## 2020-05-15 ENCOUNTER — Inpatient Hospital Stay: Payer: BC Managed Care – PPO

## 2020-05-15 DIAGNOSIS — N179 Acute kidney failure, unspecified: Secondary | ICD-10-CM | POA: Diagnosis not present

## 2020-05-15 DIAGNOSIS — S065X9A Traumatic subdural hemorrhage with loss of consciousness of unspecified duration, initial encounter: Secondary | ICD-10-CM | POA: Diagnosis not present

## 2020-05-15 LAB — CK
Total CK: 3482 U/L — ABNORMAL HIGH (ref 49–397)
Total CK: 4268 U/L — ABNORMAL HIGH (ref 49–397)

## 2020-05-15 LAB — BASIC METABOLIC PANEL
Anion gap: 8 (ref 5–15)
BUN: 18 mg/dL (ref 6–20)
CO2: 25 mmol/L (ref 22–32)
Calcium: 8.8 mg/dL — ABNORMAL LOW (ref 8.9–10.3)
Chloride: 106 mmol/L (ref 98–111)
Creatinine, Ser: 1.9 mg/dL — ABNORMAL HIGH (ref 0.61–1.24)
GFR, Estimated: 51 mL/min — ABNORMAL LOW (ref >60)
Glucose, Bld: 85 mg/dL (ref 70–99)
Potassium: 4 mmol/L (ref 3.5–5.1)
Sodium: 139 mmol/L (ref 135–145)

## 2020-05-15 IMAGING — CT CT HEAD W/O CM
3 series · 15 of 47 positions shown, 18 images · non-contrast
Comparison: Brain MRI and MRA head and neck [DATE]. head CTs
[DATE] and earlier.

CLINICAL DATA: 24-year-old male status post fall with intracranial
hemorrhage. Seizure. Nausea.

EXAM:
CT HEAD WITHOUT CONTRAST
TECHNIQUE: Contiguous axial images were obtained from the base of the skull
through the vertex without intravenous contrast.

[Series 2: head wo · axial · 0.42mm/px · z∈[-98,+32]mm · 9 of 32 slices shown, 12 images]
[im 3/32  brain]
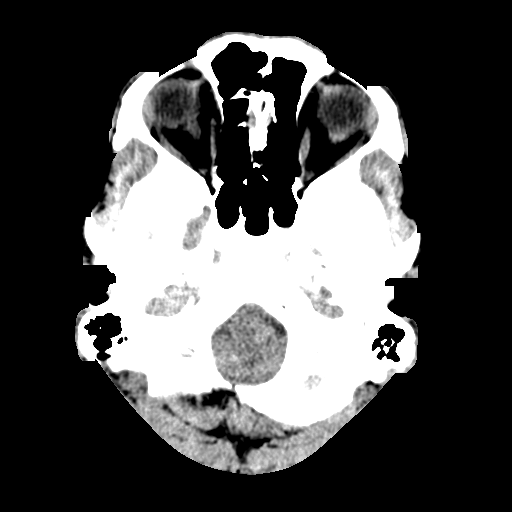
[im 3/32  bone]
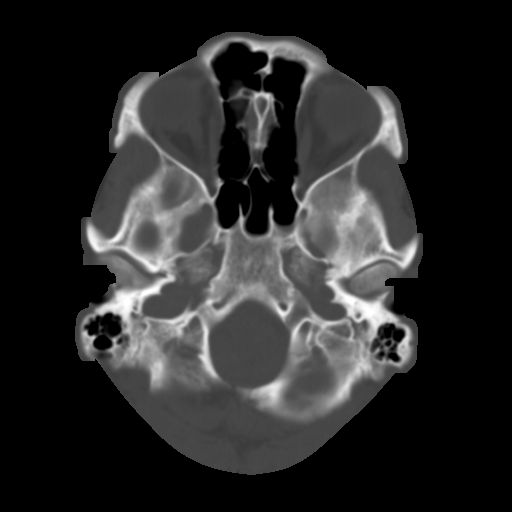
[im 6/32  brain]
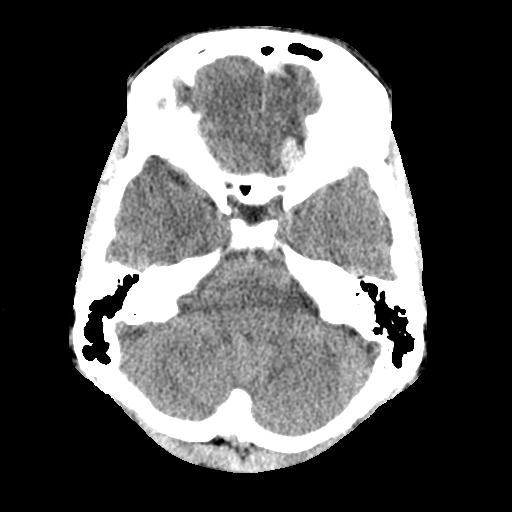
[im 9/32  brain]
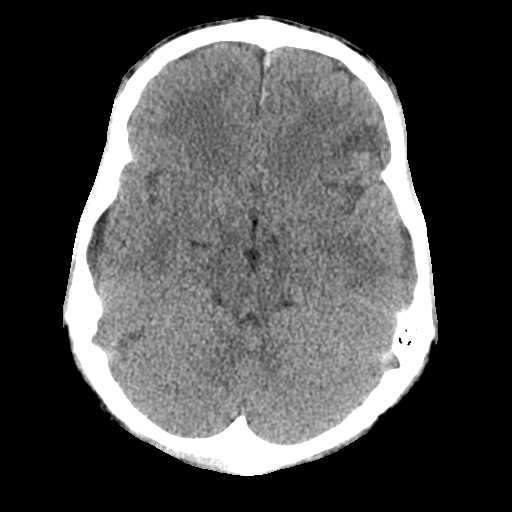
[im 12/32  brain]
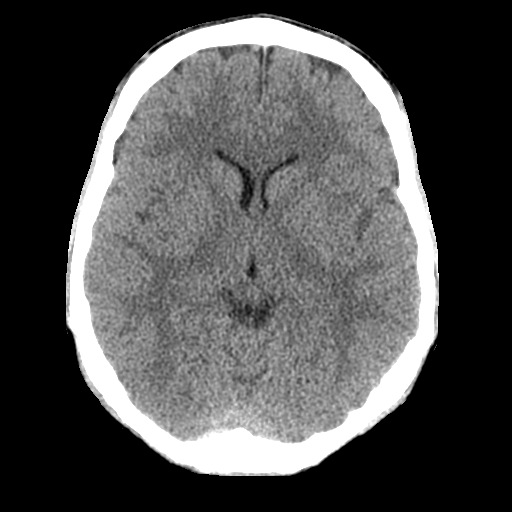
[im 17/32  brain]
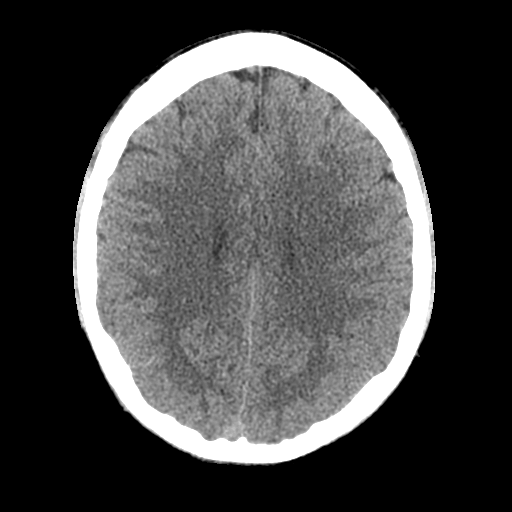
[im 17/32  bone]
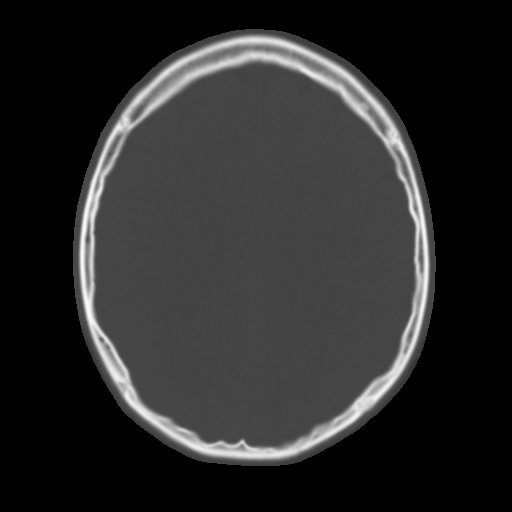
[im 20/32  brain]
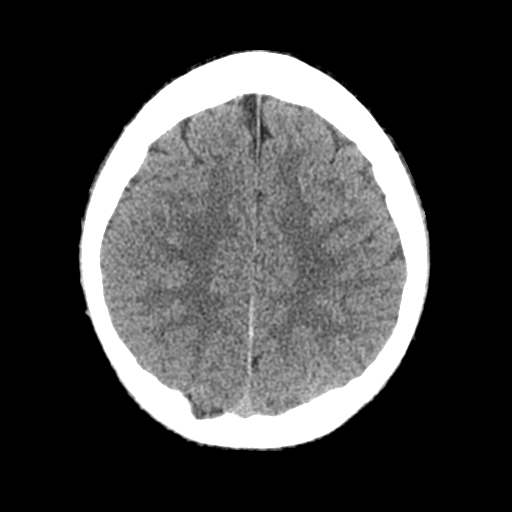
[im 23/32  brain]
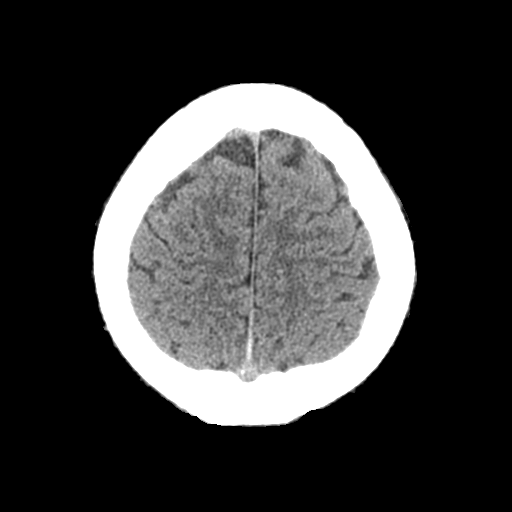
[im 26/32  brain]
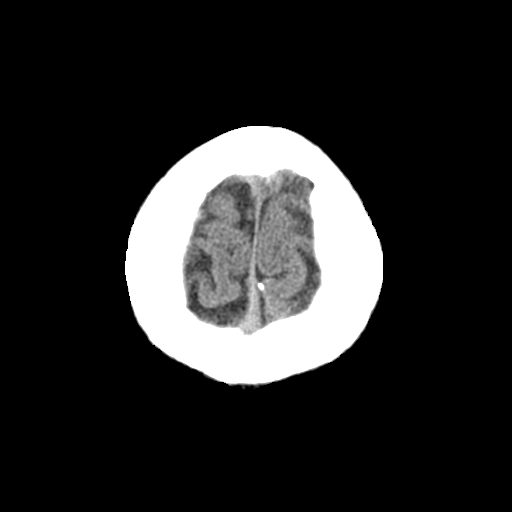
[im 29/32  brain]
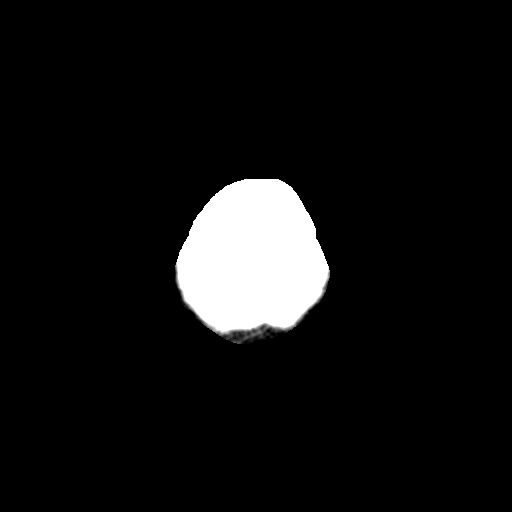
[im 29/32  bone]
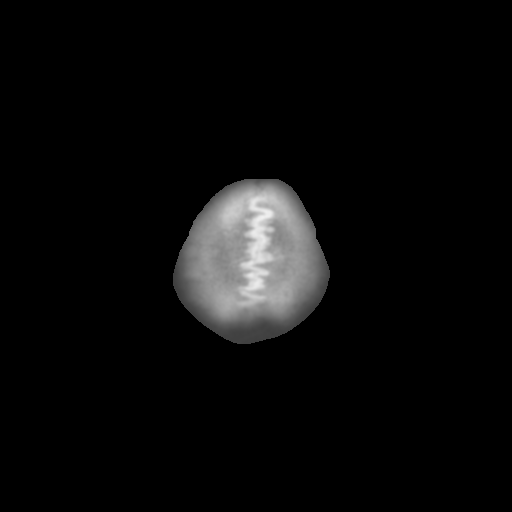

[Series 4: coronal soft tissue · coronal · 0.34mm/px · 3 of 70 slices shown]
[im 24/70  brain]
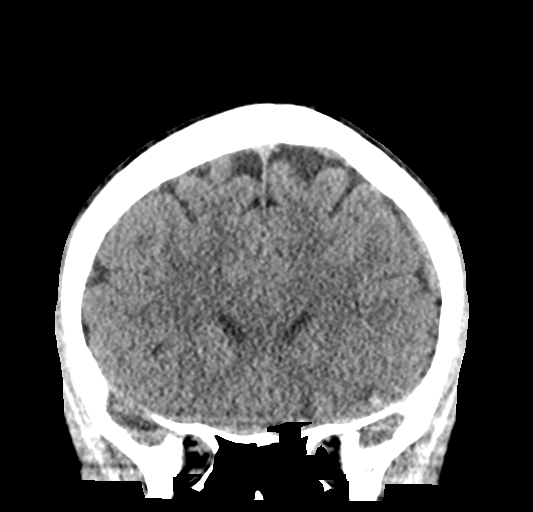
[im 31/70  brain]
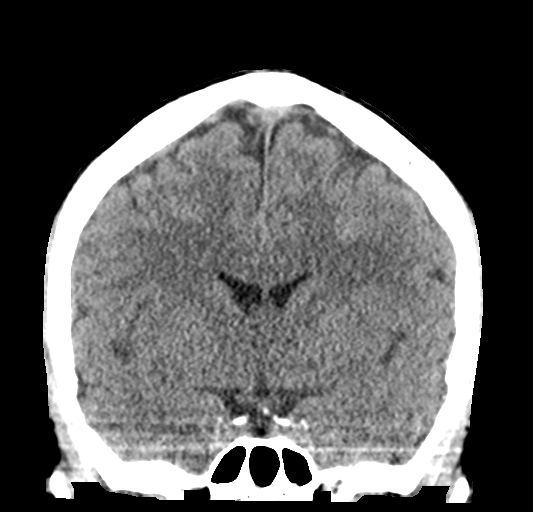
[im 39/70  brain]
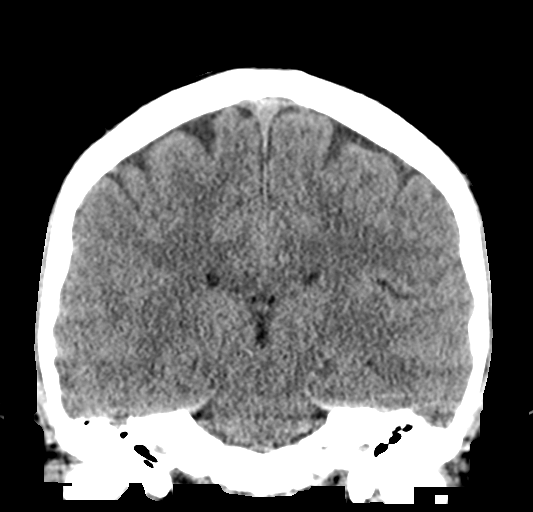

[Series 5: sagittal soft tissue · sagittal · 0.31mm/px · 3 of 61 slices shown]
[im 21/61  brain]
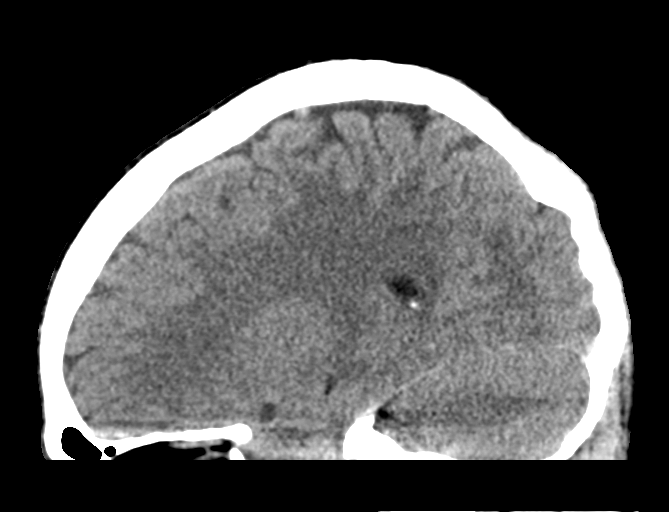
[im 31/61  brain]
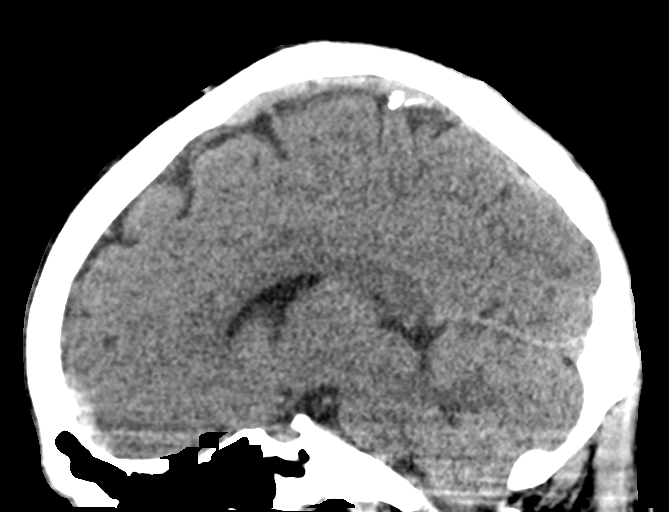
[im 41/61  brain]
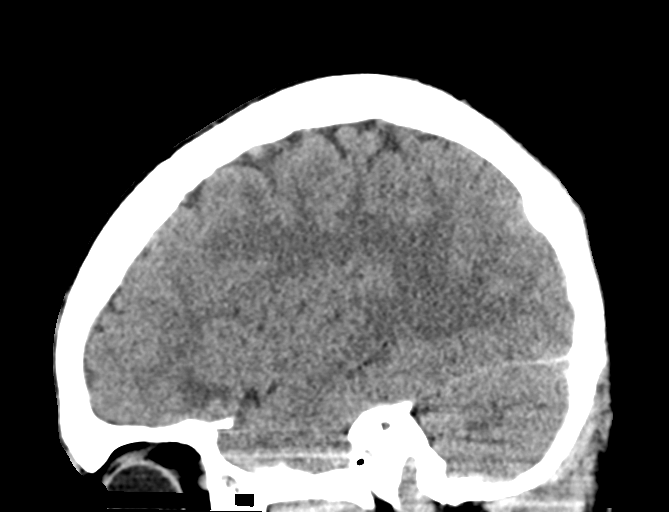

[15 of 47 positions shown; findings below may reference images not displayed]

FINDINGS: Brain: Trace bilateral subdural hematoma has been better
demonstrated by MRI. Small volume subdural blood along the falx has
largely resolved since [DATE]. No intraventricular hemorrhage or
ventriculomegaly.

Bilateral inferior frontal gyrus hemorrhagic contusions are more
apparent since [DATE] (series 2, image 8) but without
significant mass effect. Regional edema not significantly changed
from MRI.

Anterior temporal lobes largely spared as before. No midline shift.
Normal basilar cisterns.

No new intracranial hemorrhage identified. No cortically based acute
infarct identified. Stable gray-white matter differentiation
elsewhere.

Vascular: No suspicious intracranial vascular hyperdensity.

Skull: Nondisplaced medial right occipital bone fracture which
tracks to the posterior rim of the foramen magnum (series 8, image
3). No new osseous abnormality identified.

Sinuses/Orbits: Decreased fluid levels in the sphenoid sinuses.
Other Visualized paranasal sinuses and mastoids are clear.

Other: Visible orbit and normal limits scalp soft. Tissues remain
within
IMPRESSION: 1. Bilateral inferior frontal gyrus hemorrhagic contusions not
significantly changed from recent MRI. Mild edema with no
significant mass effect.

2. Trace bilateral subdural hematomas largely occult by CT. Subdural
blood along the falx has largely resolved.

3. Stable nondisplaced right occipital bone fracture to the foramen
magnum.

4. No new intracranial abnormality.

## 2020-05-15 MED ORDER — ACETAMINOPHEN 650 MG RE SUPP: 650.0000 mg | Freq: Three times a day (TID) | RECTAL | Status: DC

## 2020-05-15 MED ORDER — SODIUM CHLORIDE 0.9 % IV SOLN: INTRAVENOUS | Status: DC

## 2020-05-15 MED ORDER — ACETAMINOPHEN 500 MG PO TABS
1000.0000 mg | ORAL_TABLET | Freq: Three times a day (TID) | ORAL | Status: DC
  Administered 2020-05-15 – 2020-05-16 (×3): 1000 mg via ORAL
  Filled 2020-05-15 (×3): qty 2

## 2020-05-15 MED ORDER — BUTALBITAL-APAP-CAFFEINE 50-325-40 MG PO TABS
1.0000 | ORAL_TABLET | Freq: Four times a day (QID) | ORAL | Status: DC | PRN
  Administered 2020-05-15: 01:00:00 1 via ORAL
  Filled 2020-05-15: qty 1

## 2020-05-15 MED ORDER — SODIUM CHLORIDE 0.9 % IV SOLN
200.0000 mg | Freq: Once | INTRAVENOUS | Status: AC
  Administered 2020-05-15: 200 mg via INTRAVENOUS
  Filled 2020-05-15: qty 0.8

## 2020-05-15 MED ORDER — NICOTINE 14 MG/24HR TD PT24
14.0000 mg | MEDICATED_PATCH | Freq: Every day | TRANSDERMAL | Status: DC
  Administered 2020-05-15 – 2020-05-16 (×2): 14 mg via TRANSDERMAL
  Filled 2020-05-15 (×2): qty 1

## 2020-05-15 MED ORDER — ALPRAZOLAM 0.5 MG PO TABS
0.5000 mg | ORAL_TABLET | Freq: Three times a day (TID) | ORAL | Status: DC | PRN
  Administered 2020-05-15 (×2): 0.5 mg via ORAL
  Filled 2020-05-15 (×2): qty 1

## 2020-05-15 NOTE — Progress Notes (Signed)
Pt took a sip of sprite and sip of water and almost immediately threw up 3 times very small amounts of the liquid he just drank. I told him to hold off on drinking anything for one hour and gave him Zofran IV. At 0930 he tried drinking a bigger gulp of water and immediately threw that up as well. He said the Zofran helped though. Encouraged him to listen to his body and wait to try drinking water until he's not immediately throwing it up. Pt states understanding. Mother at bedside.

## 2020-05-15 NOTE — Progress Notes (Signed)
Neurology Progress Note  Patient ID: Steven Gill is a 25 y.o. with PMHx of anxiety  Initially consulted for: Post concussive seizure  Major interval events:  Improving AKI, Not tolerating PO due to nausea/vomitting  Subjective: Improving  Exam: Vitals:   05/15/20 0625 05/15/20 0817  BP:  139/87  Pulse:  (!) 50  Resp: 16 16  Temp:  97.6 F (36.4 C)  SpO2:  100%   Gen: In bed, winces with pain with movement Psychiatric: Pleasant and cooperative Resp: non-labored breathing, no grossly audible wheezing Cardiac: Perfusing extremities well  Abd: soft, nt  Neuro: MS: Slightly sleepy but able to engage with examiner and maintain attention CN: PERRL, EOMI, face symmetric, tongue midline  Motor: Able to ambulate to the bathroom, feeling generalized weakness but without any focal deficits Sensory: Equal in all 4 extremities  Pertinent Labs: Cr increased from 1.13 to 2.29 to 2.51 on 05/15/2019 Leukocytosis is resolved from 13 on admission to 9.9 today Mild anemia at hemoglobin of 11.8, likely dilutional K normalized  T bili mildly elevated 1.9 on 12/31 initial CK was 115, which then increased to 1158, now downtrending slightly to 1146 U tox positive for marijuana  MRI brain personally reviewed, bifrontal contusions and left temporal contusion, with a small volume subdural hemorrhage that is stable MRA head and neck personally reviewed, agree with radiology that it is normal  Head CT reviewed, stable  Impression: 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 Reviewed seizure precautions again with patient and family today. All questions were answered.  Recommendations:  # TBI - No need for EEG as patient is continuing to gradually improve; EEG likely to show irritability which does not necessarily indicate long term seizure risk. EEG would be indicated if patient does have further seizures, which currently there is no evidence of  - every  4-hour neuro checks - 7-day course of Vimpat 100 mg twice daily per standard guidelines for seizure prophylaxis in traumatic ICH (avoiding Keppra due to patient's baseline anxiety and high rate of worsening phychiatric symptoms with this medication)  - If long term AED is needed in the future, lamotrigine may be considered given its mood stabilization as well as anti-seizure effects - Observation for return to baseline, potential to need rehab - PT/OT/SLP cognitive eval - No further inpatient neurological workup needed, supportive care. Neurology available on an as-needed basis going forward - Outpatient neurology follow-up on discharge, please arrange with [facility] (Dr. Sherryll Burger or Dr. Malvin Johns)  # Headaches > Suspect secondary to TBI w/ element of caffiene withdrawal HA, may have component of CSF leak HA - Tylenol 1000 mg q8hr sheduled for 3 days - IV caffeine 200 mg once, then PO as tolerated by patient - Continue aggressive hydration - Fiorecet discontinued due to risk of rebound HA - EKG to eval for QTc and safely continue Zofran (elevated on last EKG)  # Basilar skull fracture - Appreciate neurosurgery involvement  # AKI possibly secondary to rhabdomyolysis from seizures - CK every 12 hours, trend to peak and continue IV fluids as needed - Increase to 250 cc/hr Normal saline today  Family counseled on expected gradual return to baseline, possibility of disinhibition, memory impairments, risk of future seizures but data favoring not continuing anti-seizure medication longer than 7 days unless seizures recur in the future. Also discussed potential of lamotrigine in the future to help mood and seizures, which would need to be discussed with outpatient provider   Patient's  marijuana use was not discussed with family (not examined alone to allow consent for this). Images were reviewed with father at bedside.  Repeated seizure counseling, please include in discharge  instructions:  Discharge instructions:  Regarding your seizures:  Per [facility] statutes, patients with seizures are not allowed to drive until  they have been seizure-free for six months. Use caution when using heavy equipment or power tools. Avoid working on ladders or at heights. Take showers instead of baths. Ensure the water temperature is not too high on the home water heater. Do not go swimming alone. When caring for infants or small children, sit down when holding, feeding, or changing them to minimize risk of injury to the child in the event you have a seizure.  To reduce risk of seizures, maintain good sleep hygiene avoid alcohol and illicit drug use, take all anti-seizure medications as prescribed.   Regarding your headaches: -Please drink liberal amounts of water, aim to urinate 6-8 times a day, and eat regular meals with normal salt intake, with as much cafffeine intake as tolerated -Continue Tylenol 1000 mg every 8 hours for up to 3 days or until headache has fully resolved with 1 additional dose after headache has resolved, if HA persistent please contact PCP for further recommendations -No NSAIDs for at least 2 weeks due to cerebral contusions -Please keep a headache log to discuss with your neurologist on follow-up -Lifestyle measures that help reduce migraines including stress management, regular meals, regular sleep, regular aerobic exercise, and adequate fluid intake to achieve 6-8 urinations per day.  Diet can also play a role and patient is encouraged to track potential dietary triggers. Caffeine eventually should be tapered to 200 mg daily.   Brooke Dare MD-PhD [facility] [phone]   [facility] coverage for [facility] is from 8 AM to 4 AM in-house and 4 PM to 8 PM by telephone/video. 8 PM to 8 AM emergent questions or overnight urgent questions should be addressed to Teleneurology On-call or [facility] neurohospitalist; contact information  can be found on AMION

## 2020-05-15 NOTE — Progress Notes (Signed)
Pt c/o mild chest pain constant when his father arrived to the room to stay the night with him. Mother currently here and getting ready to leave. Dr. Renford Dills notified and xanax ordered. Pt talking telling stories in the room despite the chest discomfort. Will continue to monitor.

## 2020-05-15 NOTE — Progress Notes (Signed)
PROGRESS NOTE    Steven Gill  JGG:[id number] DOB: 1996/02/15 DOA: 05/12/2020 PCP: Ronnette Juniper, MD   Chief Complain: Fall  Brief Narrative: Patient is a 25 year old male with history of needle phobia who was in his PCPs office for regular blood draw.  He became very anxious and syncopized after the blood draw.  He became lethargic afterwards and was brought to the emergency department.  In the emergency department, he had a witnessed seizure.  Imagings of the brain showed 3 to 5 mm subdural hematoma, multifocal hemorrhage and contusion, nondepressed basilar skull fracture.  Neurosurgery, neurology consulted.  Hospital course was remarkable for development of AKI,elevated CK.  Assessment & Plan:   Principal Problem:   Subdural hematoma (HCC) Active Problems:   Hypokalemia   Syncope, vasovagal   Proteinuria   Seizure-like activity (HCC)   AKI (acute kidney injury) (HCC)   ICH (intracerebral hemorrhage) (HCC)   Fall   Seizure precipitated by fall: In the emergency department, he had a witnessed seizure.  Neurology following.  Neurology recommended Vimpat for total of  7 days course.  Follow-up as an outpatient with neurology. Continue seizure precautions.  Neurology did not recommend EEG.  Traumatic brain injury: Imagings of the brain showed 3 to 5 mm subdural hematoma, multifocal hemorrhage and contusion, nondepressed basilar skull fracture.  Neurosurgery, neurology consulted.  Hospital course was remarkable for development of AKI.  Monitor mental status.  Currently alert and oriented. Neurology was following.  Recommended to continue Vimpat for 7 days PT/OT evaluation done,no follow up recommended.  AKI: Continue IV fluids.  Monitor BMP.  Most likely he might have developed acute tubular necrosis from rhabdomyolysis from seizures.AKI is improving  Elevated  CK: increasing .Conitnue fluids at 250 m/.hr.  Leukocytosis: Most likely this is reactive.Resolved  Severe  hypokalemia: Supplemented and corrected.  Nausea/Vomiting/headache: Continue PRN meds  Bradycardia: Asymptomatic. F/U EKG and ECho          DVT prophylaxis:SCD Code Status: Full Family Communication: Mother at the bedside Status is: Inpatient    Dispo: The patient is from: Home              Anticipated d/c is to: Home              Anticipated d/c date is:1 days              Patient currently is not medically stable to d/c. Still has significantly elevated CK   Consultants: Neurology, neurosurgery  Procedures: None  Antimicrobials: None Anti-infectives (From admission, onward)   None      Subjective:  Patient seen and examined the bedside this morning.  Hemodynamically stable.  Alert and awake and oriented.Had some nausea this mrng. Very eager to go home  Objective: Vitals:   05/15/20 0611 05/15/20 0625 05/15/20 0817 05/15/20 1216  BP:   139/87 137/89  Pulse:   (!) 50 (!) 51  Resp: 18 16 16 16   Temp:   97.6 F (36.4 C) 99 F (37.2 C)  TempSrc:      SpO2:   100% 100%  Weight:      Height:        Intake/Output Summary (Last 24 hours) at 05/15/2020 1519 Last data filed at 05/15/2020 0600 Gross per 24 hour  Intake 1789.55 ml  Output 250 ml  Net 1539.55 ml   Filed Weights   05/12/20 1622  Weight: 83.9 kg    Examination:  General exam: Appears calm and comfortable ,Not in distress,average built  HEENT:PERRL,Oral mucosa moist, Ear/Nose normal on gross exam Respiratory system: Bilateral equal air entry, normal vesicular breath sounds, no wheezes or crackles  Cardiovascular system: S1 & S2 heard, RRR. No JVD, murmurs, rubs, gallops or clicks. Gastrointestinal system: Abdomen is nondistended, soft and nontender. No organomegaly or masses felt. Normal bowel sounds heard. Central nervous system: Alert and oriented. No focal neurological deficits. Extremities: No edema, no clubbing ,no cyanosis, distal peripheral pulses palpable. Skin: No rashes, lesions or  ulcers,no icterus ,no pallor    Data Reviewed: I have personally reviewed following labs and imaging studies  CBC: Recent Labs  Lab 05/12/20 1624 05/13/20 0343 05/14/20 0436  WBC 12.2* 13.0* 9.9  NEUTROABS  --  11.9* 8.3*  HGB 14.9 14.3 11.8*  HCT 41.3 40.6 32.8*  MCV 81.0 81.7 81.6  PLT 242 160 121*   Basic Metabolic Panel: Recent Labs  Lab 05/12/20 1624 05/13/20 0343 05/14/20 0436 05/15/20 0409  NA 139 138 138 139  K 2.8* 4.7 4.2 4.0  CL 102 106 106 106  CO2 24 21* 23 25  GLUCOSE 165* 121* 113* 85  BUN 11 17 23* 18  CREATININE 1.13 2.29* 2.51* 1.90*  CALCIUM 9.1 9.2 8.9 8.8*  MG 1.9 2.8*  --   --   PHOS 2.2*  --   --   --    GFR: Estimated Creatinine Clearance: 70.9 mL/min (A) (by C-G formula based on SCr of 1.9 mg/dL (H)). Liver Function Tests: Recent Labs  Lab 05/13/20 0343  AST 40  40  ALT 28  29  ALKPHOS 48  48  BILITOT 1.9*  1.9*  PROT 7.8  7.7  ALBUMIN 4.7  4.8   No results for input(s): LIPASE, AMYLASE in the last 168 hours. No results for input(s): AMMONIA in the last 168 hours. Coagulation Profile: Recent Labs  Lab 05/13/20 0343  INR 1.0   Cardiac Enzymes: Recent Labs  Lab 05/13/20 0343 05/14/20 0024 05/14/20 0436 05/15/20 0409 05/15/20 1401  CKTOTAL 672* 1,158* 1,146* 3,482* 4,268*   BNP (last 3 results) No results for input(s): PROBNP in the last 8760 hours. HbA1C: No results for input(s): HGBA1C in the last 72 hours. CBG: No results for input(s): GLUCAP in the last 168 hours. Lipid Profile: No results for input(s): CHOL, HDL, LDLCALC, TRIG, CHOLHDL, LDLDIRECT in the last 72 hours. Thyroid Function Tests: No results for input(s): TSH, T4TOTAL, FREET4, T3FREE, THYROIDAB in the last 72 hours. Anemia Panel: No results for input(s): VITAMINB12, FOLATE, FERRITIN, TIBC, IRON, RETICCTPCT in the last 72 hours. Sepsis Labs: No results for input(s): PROCALCITON, LATICACIDVEN in the last 168 hours.  Recent Results (from the  past 240 hour(s))  SARS CORONAVIRUS 2 (TAT 6-24 HRS) Nasopharyngeal Nasopharyngeal Swab     Status: None   Collection Time: 05/12/20 11:42 PM   Specimen: Nasopharyngeal Swab  Result Value Ref Range Status   SARS Coronavirus 2 NEGATIVE NEGATIVE Final    Comment: (NOTE) SARS-CoV-2 target nucleic acids are NOT DETECTED.  The SARS-CoV-2 RNA is generally detectable in upper and lower respiratory specimens during the acute phase of infection. Negative results do not preclude SARS-CoV-2 infection, do not rule out co-infections with other pathogens, and should not be used as the sole basis for treatment or other patient management decisions. Negative results must be combined with clinical observations, patient history, and epidemiological information. The expected result is Negative.  Fact Sheet for Patients: HairSlick.no  Fact Sheet for Healthcare Providers: quierodirigir.com  This test is not yet approved  or cleared by the [place] and  has been authorized for detection and/or diagnosis of SARS-CoV-2 by FDA under an Emergency Use Authorization (EUA). This EUA will remain  in effect (meaning this test can be used) for the duration of the COVID-19 declaration under Se ction 564(b)(1) of the Act, 21 U.S.C. section 360bbb-3(b)(1), unless the authorization is terminated or revoked sooner.  Performed at [facility], [place] [address] [place], [place] [address]          Radiology Studies: CT HEAD WO CONTRAST  Result Date: 05/15/2020 CLINICAL DATA:  24 year old male status post fall with intracranial hemorrhage. Seizure. Nausea. EXAM: CT HEAD WITHOUT CONTRAST TECHNIQUE: Contiguous axial images were obtained from the base of the skull through the vertex without intravenous contrast. COMPARISON:  Brain MRI and MRA head and neck 05/13/2020. head CTs 05/12/2020 and earlier. FINDINGS: Brain: Trace bilateral subdural hematoma  has been better demonstrated by MRI. Small volume subdural blood along the falx has largely resolved since 05/12/2020. No intraventricular hemorrhage or ventriculomegaly. Bilateral inferior frontal gyrus hemorrhagic contusions are more apparent since 05/12/2020 (series 2, image 8) but without significant mass effect. Regional edema not significantly changed from MRI. Anterior temporal lobes largely spared as before. No midline shift. Normal basilar cisterns. No new intracranial hemorrhage identified. No cortically based acute infarct identified. Stable gray-white matter differentiation elsewhere. Vascular: No suspicious intracranial vascular hyperdensity. Skull: Nondisplaced medial right occipital bone fracture which tracks to the posterior rim of the foramen magnum (series 8, image 3). No new osseous abnormality identified. Sinuses/Orbits: Decreased fluid levels in the sphenoid sinuses. Other Visualized paranasal sinuses and mastoids are clear. Other: Visible orbit and normal limits scalp soft. Tissues remain within IMPRESSION: 1. Bilateral inferior frontal gyrus hemorrhagic contusions not significantly changed from recent MRI. Mild edema with no significant mass effect. 2. Trace bilateral subdural hematomas largely occult by CT. Subdural blood along the falx has largely resolved. 3. Stable nondisplaced right occipital bone fracture to the foramen magnum. 4. No new intracranial abnormality. Electronically Signed   By: Genevie Ann M.D.   On: 05/15/2020 09:38        Scheduled Meds: . acetaminophen  1,000 mg Oral Q8H   Or  . acetaminophen  650 mg Rectal Q8H  . lacosamide  100 mg Oral Q12H  . potassium chloride  40 mEq Oral Once   Continuous Infusions: . sodium chloride 250 mL/hr at 05/15/20 0829  . lacosamide (VIMPAT) IV Stopped (05/13/20 2330)  . lactated ringers 150 mL/hr at 05/15/20 0600     LOS: 2 days    Time spent: 25 mins.More than 50% of that time was spent in counseling and/or coordination  of care.      Shelly Coss, MD [facility] P1/06/2020, 3:19 PM

## 2020-05-16 ENCOUNTER — Inpatient Hospital Stay (HOSPITAL_COMMUNITY)
Admit: 2020-05-16 | Discharge: 2020-05-16 | Disposition: A | Discharge status: Home / Self Care | Payer: BC Managed Care – PPO | Attending: Internal Medicine | Admitting: Internal Medicine

## 2020-05-16 ENCOUNTER — Other Ambulatory Visit: Payer: Self-pay | Admitting: Respiratory Therapy

## 2020-05-16 ENCOUNTER — Inpatient Hospital Stay
Admit: 2020-05-16 | Discharge: 2020-05-16 | Disposition: A | Discharge status: Home / Self Care | Payer: BC Managed Care – PPO | Attending: Physician Assistant | Admitting: Physician Assistant

## 2020-05-16 DIAGNOSIS — R55 Syncope and collapse: Secondary | ICD-10-CM | POA: Diagnosis not present

## 2020-05-16 DIAGNOSIS — N179 Acute kidney failure, unspecified: Secondary | ICD-10-CM | POA: Diagnosis not present

## 2020-05-16 DIAGNOSIS — R001 Bradycardia, unspecified: Secondary | ICD-10-CM

## 2020-05-16 LAB — ECHOCARDIOGRAM COMPLETE
AR max vel: 2.18 cm2
AV Area VTI: 2.42 cm2
AV Area mean vel: 2.15 cm2
AV Mean grad: 3 mmHg
AV Peak grad: 6.3 mmHg
Ao pk vel: 1.25 m/s
Area-P 1/2: 5.66 cm2
Height: 74 in
S' Lateral: 2.7 cm
Weight: 2960 oz

## 2020-05-16 LAB — BASIC METABOLIC PANEL
Anion gap: 7 (ref 5–15)
BUN: 12 mg/dL (ref 6–20)
CO2: 24 mmol/L (ref 22–32)
Calcium: 8.4 mg/dL — ABNORMAL LOW (ref 8.9–10.3)
Chloride: 109 mmol/L (ref 98–111)
Creatinine, Ser: 1.47 mg/dL — ABNORMAL HIGH (ref 0.61–1.24)
GFR, Estimated: >60 mL/min (ref >60)
Glucose, Bld: 83 mg/dL (ref 70–99)
Potassium: 3.9 mmol/L (ref 3.5–5.1)
Sodium: 140 mmol/L (ref 135–145)

## 2020-05-16 LAB — TSH: TSH: 2.644 u[IU]/mL (ref 0.350–4.500)

## 2020-05-16 LAB — CK: Total CK: 4589 U/L — ABNORMAL HIGH (ref 49–397)

## 2020-05-16 MED ORDER — LACOSAMIDE 100 MG PO TABS: 100.0000 mg | ORAL_TABLET | Freq: Two times a day (BID) | ORAL | 0 refills | Status: DC

## 2020-05-16 MED ORDER — ACETAMINOPHEN 325 MG PO TABS
650.0000 mg | ORAL_TABLET | Freq: Four times a day (QID) | ORAL | Status: DC | PRN
  Administered 2020-05-16: 650 mg via ORAL
  Filled 2020-05-16: qty 2

## 2020-05-16 MED ORDER — ONDANSETRON HCL 4 MG PO TABS: 4.0000 mg | ORAL_TABLET | Freq: Four times a day (QID) | ORAL | 0 refills | Status: DC | PRN

## 2020-05-16 MED ORDER — ACETAMINOPHEN 325 MG PO TABS: 650.0000 mg | ORAL_TABLET | Freq: Four times a day (QID) | ORAL | 1 refills | Status: DC | PRN

## 2020-05-16 MED ORDER — NICOTINE 14 MG/24HR TD PT24: 14.0000 mg | MEDICATED_PATCH | Freq: Every day | TRANSDERMAL | 0 refills | Status: DC

## 2020-05-16 NOTE — TOC Initial Note (Signed)
Transition of Care [facility]) - Initial/Assessment Note    Patient Details  Name: Steven Gill MRN: [id number] Date of Birth: Mar 04, 1996  [facility] CM/SW Contact:    Magnus Ivan, LCSW Phone Number: 05/16/2020, 11:16 AM  Clinical Narrative:             CSW met with patient and patient's mother at bedside. 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 PCP is Dr. Baldemar Lenis. Pharmacy is [facility] [address] No DME, HH, or SNF history. Patient can drive himself to appointments, his parents will drive him for now until it is safe for him to drive again. Explained OT recommendation for OPOT. Patient agreeable to this. Provided list of OP Rehab Facilities. Patient chooses [facility]. CSW will fax referral form over. No other needs identified prior to DC.      Expected Discharge Plan: OP Rehab Barriers to Discharge: Continued Medical Work up   Patient Goals and CMS Choice Patient states their goals for this hospitalization and ongoing recovery are:: return home with parents CMS Medicare.gov Compare Post Acute Care list provided to:: Patient Choice offered to / list presented to : [place]  Expected Discharge Plan and Services Expected Discharge Plan: OP Rehab       Living arrangements for the past 2 months: Single Family Home                                      Prior Living Arrangements/Services Living arrangements for the past 2 months: Single Family Home Lives with:: Parents Patient language and need for interpreter reviewed:: Yes Do you feel safe going back to the place where you live?: Yes      Need for Family Participation in Patient Care: Yes (Comment) Care giver support system in place?: Yes (comment)   Criminal Activity/Legal Involvement Pertinent to Current Situation/Hospitalization: No - Comment as needed  Activities of Daily Living Home Assistive Devices/Equipment: None ADL  Screening (condition at time of admission) Patient's cognitive ability adequate to safely complete daily activities?: Yes Is the patient deaf or have difficulty hearing?: No Does the patient have difficulty seeing, even when wearing glasses/contacts?: No Does the patient have difficulty concentrating, remembering, or making decisions?: No Patient able to express need for assistance with ADLs?: Yes Does the patient have difficulty dressing or bathing?: No Independently performs ADLs?: Yes (appropriate for developmental age) Does the patient have difficulty walking or climbing stairs?: Yes Weakness of Legs: None Weakness of Arms/Hands: None  Permission Sought/Granted Permission sought to share information with : Chartered certified accountant granted to share information with : Yes, Verbal Permission Granted     Permission granted to share info w AGENCY: OP Rehab        Emotional Assessment       Orientation: : Oriented to Self,Oriented to Place,Oriented to  Time,Oriented to Situation Alcohol / Substance Use: Not Applicable Psych Involvement: No (comment)  Admission diagnosis:  Seizure ([place]) [R56.9] Subdural hematoma ([place]) [S06.5X9A] ICH (intracerebral hemorrhage) ([place]) [I61.9] Fall [W19.XXXA] Altered mental status, unspecified altered mental status type [R41.82] Syncope, unspecified syncope type [R55] Patient Active Problem List   Diagnosis Date Noted  . ICH (intracerebral hemorrhage) ([place]) 05/14/2020  . Fall 05/14/2020  . Subdural hematoma ([place]) 05/13/2020  . Hypokalemia 05/13/2020  . Syncope, vasovagal 05/13/2020  .  Proteinuria 05/13/2020  . Seizure-like activity ([place]) 05/13/2020  . AKI (acute kidney injury) ([place]) 05/13/2020   PCP:  Ander Slade, MD Pharmacy:  No Pharmacies Listed    Social Determinants of Health (SDOH) Interventions    Readmission Risk Interventions No flowsheet data found.

## 2020-05-16 NOTE — Progress Notes (Signed)
*  PRELIMINARY RESULTS* Echocardiogram 2D Echocardiogram has been performed.  Steven Gill 05/16/2020, 10:40 AM

## 2020-05-16 NOTE — Discharge Summary (Signed)
Physician Discharge Summary  Steven Gill XAJ:[id number] DOB: 1996/02/06 DOA: 05/12/2020  PCP: Ander Slade, MD  Admit date: 05/12/2020 Discharge date: 05/16/2020  Admitted From: Home Disposition:  Home  Discharge Condition:Stable CODE STATUS:FULL Diet recommendation: Regular  Brief/Interim Summary: Patient is a 25 year old male with history of needle phobia who was in his PCPs office for regular blood draw.  He became very anxious and syncopized after the blood draw.  He became lethargic afterwards and was brought to the emergency department.  In the emergency department, he had a witnessed seizure.  Imagings of the brain showed 3 to 5 mm subdural hematoma, multifocal hemorrhage and contusion, nondepressed basilar skull fracture.  Neurosurgery, neurology consulted.  Hospital course was remarkable for development of AKI, elevated CK.  He was vigorously fluid resuscitated.  His CK level has remained high but  steady, kidney function is improving.  Patient is adamant about being discharged and wants to check a CK level as an outpatient.  He is being discharged home today.    Following problems were addressed during his hospitalization:  Seizure precipitated by fall: In the emergency department, he had a witnessed seizure.  Neurology following.  Neurology recommended Vimpat for 7 days course.  Follow-up as an outpatient with neurology in 4 week.  Traumatic brain injury: Imagings of the brain showed 3 to 5 mm subdural hematoma, multifocal hemorrhage and contusion, nondepressed basilar skull fracture.  Neurosurgery, neurology consulted.  PT/OT evaluation done,no follow up recommended  AKI: Improving with IV fluids.  Most likely he might have developed acute tubular necrosis from rhabdomyolysis from seizures. Check Bmp in 3 days  Elevated  CK: Vigorously fluid resuscitated.  Check CK level in 3 days.  Leukocytosis: Most likely this is reactive.Resolved  Severe hypokalemia: Supplemented  and corrected.  Bradycardia: Persistent bradycardia in the range of 40-60.  Patient is asymptomatic.  Echocardiogram did not show any abnormal findings.  Cardiology was consulted.  This is normal.  Cardiology recommended Zio patch for 2 weeks and follow-up as an outpatient   Discharge Diagnoses:  Principal Problem:   Subdural hematoma ([place]) Active Problems:   Hypokalemia   Syncope, vasovagal   Proteinuria   Seizure-like activity ([place])   AKI (acute kidney injury) ([place])   ICH (intracerebral hemorrhage) ([place])   Fall   Bradycardia    Discharge Instructions  Discharge Instructions    Diet general   Complete by: As directed    Discharge instructions   Complete by: As directed    1)please follow up with your PCP on Thursday(05/19/20)  Due to CK and BMP tests during the follow-up 2)Take prescribed medications as instructed 3)Avoid strenuous physical activity, contact sports for next 3 to 4 weeks 4)Take plenty of water  5) follow-up with neurology in 4 weeks.  Name and number the provider has been attached 6)You will be called by cardiology for follow-up appointment   Increase activity slowly   Complete by: As directed      Allergies as of 05/16/2020   No Known Allergies     Medication List    STOP taking these medications   meloxicam 15 MG tablet Commonly known as: MOBIC     TAKE these medications   acetaminophen 325 MG tablet Commonly known as: TYLENOL Take 2 tablets (650 mg total) by mouth every 6 (six) hours as needed (once).   Lacosamide 100 MG Tabs Take 1 tablet (100 mg total) by mouth every 12 (twelve) hours for 3 days.   nicotine 14 mg/24hr  patch Commonly known as: NICODERM CQ - dosed in mg/24 hours Place 1 patch (14 mg total) onto the skin daily. Start taking on: May 17, 2020   ondansetron 4 MG tablet Commonly known as: ZOFRAN Take 1 tablet (4 mg total) by mouth every 6 (six) hours as needed for nausea.       Follow-up Information    Ronnette Juniper,  MD. Schedule an appointment as soon as possible for a visit in 1 week(s).   Specialty: Pediatrics Contact information: [address] [facility] [address] [facility] [place] [address] [phone]        Lonell Face, MD. Schedule an appointment as soon as possible for a visit in 4 week(s).   Specialty: Neurology Contact information: [address] [facility] [place] [address] [phone]              No Known Allergies  Consultations:  Neurology, neurosurgery, cardiology   Procedures/Studies: CT HEAD WO CONTRAST  Result Date: 05/15/2020 CLINICAL DATA:  25 year old male status post fall with intracranial hemorrhage. Seizure. Nausea. EXAM: CT HEAD WITHOUT CONTRAST TECHNIQUE: Contiguous axial images were obtained from the base of the skull through the vertex without intravenous contrast. COMPARISON:  Brain MRI and MRA head and neck 05/13/2020. head CTs 05/12/2020 and earlier. FINDINGS: Brain: Trace bilateral subdural hematoma has been better demonstrated by MRI. Small volume subdural blood along the falx has largely resolved since 05/12/2020. No intraventricular hemorrhage or ventriculomegaly. Bilateral inferior frontal gyrus hemorrhagic contusions are more apparent since 05/12/2020 (series 2, image 8) but without significant mass effect. Regional edema not significantly changed from MRI. Anterior temporal lobes largely spared as before. No midline shift. Normal basilar cisterns. No new intracranial hemorrhage identified. No cortically based acute infarct identified. Stable gray-white matter differentiation elsewhere. Vascular: No suspicious intracranial vascular hyperdensity. Skull: Nondisplaced medial right occipital bone fracture which tracks to the posterior rim of the foramen magnum (series 8, image 3). No new osseous abnormality identified. Sinuses/Orbits: Decreased fluid levels in the sphenoid sinuses. Other Visualized paranasal sinuses  and mastoids are clear. Other: Visible orbit and normal limits scalp soft. Tissues remain within IMPRESSION: 1. Bilateral inferior frontal gyrus hemorrhagic contusions not significantly changed from recent MRI. Mild edema with no significant mass effect. 2. Trace bilateral subdural hematomas largely occult by CT. Subdural blood along the falx has largely resolved. 3. Stable nondisplaced right occipital bone fracture to the foramen magnum. 4. No new intracranial abnormality. Electronically Signed   By: Odessa Fleming M.D.   On: 05/15/2020 09:38   CT Head Wo Contrast  Result Date: 05/12/2020 CLINICAL DATA:  Fall.  Re-evaluate bleed EXAM: CT HEAD WITHOUT CONTRAST CT CERVICAL SPINE WITHOUT CONTRAST TECHNIQUE: Multidetector CT imaging of the head and cervical spine was performed following the standard protocol without intravenous contrast. Multiplanar CT image reconstructions of the cervical spine were also generated. COMPARISON:  CT head 05/12/2020 FINDINGS: CT HEAD FINDINGS Brain: Subdural hematoma again demonstrated along the left side of the falx measuring about 5 mm maximal depth. Additional parenchymal contusion demonstrated in the left inferior frontal lobe. Mild progression in the size and conspicuity of the left inferior frontal lobe hemorrhage since previous study. No new hemorrhage. No significant mass effect or midline shift. No ventricular dilatation. Gray-white matter junctions are distinct. Vascular: No acute abnormalities. Skull: Nondepressed basal skull fractures again demonstrated with fracture lines extending to the foramen magnum. Sinuses/Orbits: Air-fluid levels in the sphenoid sinus. Mastoid air cells are clear. Other: Note that  the CT head was apparently reconstructed with images flipped right to left with respect to the typical image orientation. CT CERVICAL SPINE FINDINGS Alignment: Straightening of the usual cervical lordosis and cervical scoliosis convex towards the right without anterior  subluxation. This is likely positional but muscle spasm could also have this appearance. Normal alignment of the posterior elements. C1-2 articulation appears intact. Skull base and vertebrae: Right basal occipital skull fractures again demonstrated. Soft tissues and spinal canal: No prevertebral soft tissue swelling. No abnormal paraspinal soft tissue mass or infiltration. Disc levels:  Intervertebral disc space heights are preserved. Upper chest: Visualized lung apices are clear. Other: None. IMPRESSION: 1. Subdural hematoma again demonstrated along the left side of the falx measuring about 5 mm maximal depth. No change. 2. Parenchymal contusions in the left inferior frontal lobe with mild progression in size and conspicuity since previous study. No significant mass effect or midline shift. 3. Nondepressed basal skull fractures again demonstrated. 4. Nonspecific straightening of usual cervical lordosis and cervical scoliosis convex towards the right without anterior subluxation. This is likely positional but muscle spasm could also have this appearance. 5. No acute displaced fractures identified in the cervical spine. Electronically Signed   By: Burman Nieves M.D.   On: 05/12/2020 23:54   CT HEAD WO CONTRAST  Result Date: 05/12/2020 CLINICAL DATA:  Syncope.  Fall with head injury EXAM: CT HEAD WITHOUT CONTRAST TECHNIQUE: Contiguous axial images were obtained from the base of the skull through the vertex without intravenous contrast. COMPARISON:  None. FINDINGS: Brain: Acute interhemispheric subdural hematoma anteriorly measuring approximately 3 mm in thickness. Additional small area of hemorrhagic contusion in the left inferior frontal lobe measuring approximately 5 mm. No subarachnoid hemorrhage. Ventricle size normal.  Negative for acute infarct or mass lesion. Vascular: Negative for hyperdense vessel Skull: Right occipital skull fracture, acute appearing. Nondisplaced fracture. Sinuses/Orbits: Small  air-fluid level in the sphenoid sinus. No skull base fracture identified. Negative orbit Other: None IMPRESSION: 3 mm interhemispheric subdural hematoma anteriorly. Small hemorrhagic contusion left inferior frontal lobe Nondisplaced fracture right occipital bone. These results were called by telephone at the time of interpretation on 05/12/2020 at 4:50 pm to provider Katrinka Blazing, who verbally acknowledged these results. Electronically Signed   By: Marlan Palau M.D.   On: 05/12/2020 16:50   CT Cervical Spine Wo Contrast  Result Date: 05/12/2020 CLINICAL DATA:  Fall.  Re-evaluate bleed EXAM: CT HEAD WITHOUT CONTRAST CT CERVICAL SPINE WITHOUT CONTRAST TECHNIQUE: Multidetector CT imaging of the head and cervical spine was performed following the standard protocol without intravenous contrast. Multiplanar CT image reconstructions of the cervical spine were also generated. COMPARISON:  CT head 05/12/2020 FINDINGS: CT HEAD FINDINGS Brain: Subdural hematoma again demonstrated along the left side of the falx measuring about 5 mm maximal depth. Additional parenchymal contusion demonstrated in the left inferior frontal lobe. Mild progression in the size and conspicuity of the left inferior frontal lobe hemorrhage since previous study. No new hemorrhage. No significant mass effect or midline shift. No ventricular dilatation. Gray-white matter junctions are distinct. Vascular: No acute abnormalities. Skull: Nondepressed basal skull fractures again demonstrated with fracture lines extending to the foramen magnum. Sinuses/Orbits: Air-fluid levels in the sphenoid sinus. Mastoid air cells are clear. Other: Note that the CT head was apparently reconstructed with images flipped right to left with respect to the typical image orientation. CT CERVICAL SPINE FINDINGS Alignment: Straightening of the usual cervical lordosis and cervical scoliosis convex towards the right without anterior subluxation. This is  likely positional but muscle  spasm could also have this appearance. Normal alignment of the posterior elements. C1-2 articulation appears intact. Skull base and vertebrae: Right basal occipital skull fractures again demonstrated. Soft tissues and spinal canal: No prevertebral soft tissue swelling. No abnormal paraspinal soft tissue mass or infiltration. Disc levels:  Intervertebral disc space heights are preserved. Upper chest: Visualized lung apices are clear. Other: None. IMPRESSION: 1. Subdural hematoma again demonstrated along the left side of the falx measuring about 5 mm maximal depth. No change. 2. Parenchymal contusions in the left inferior frontal lobe with mild progression in size and conspicuity since previous study. No significant mass effect or midline shift. 3. Nondepressed basal skull fractures again demonstrated. 4. Nonspecific straightening of usual cervical lordosis and cervical scoliosis convex towards the right without anterior subluxation. This is likely positional but muscle spasm could also have this appearance. 5. No acute displaced fractures identified in the cervical spine. Electronically Signed   By: Burman NievesWilliam  Stevens M.D.   On: 05/12/2020 23:54   MR ANGIO HEAD WO CONTRAST  Result Date: 05/13/2020 CLINICAL DATA:  Syncope with intracranial hemorrhage. EXAM: MRI HEAD WITHOUT AND WITH CONTRAST MRA HEAD WITHOUT CONTRAST MRA NECK WITHOUT AND WITH CONTRAST TECHNIQUE: Multiplanar, multiecho pulse sequences of the brain and surrounding structures were obtained without and with intravenous contrast. Angiographic images of the Circle of Willis were obtained using MRA technique without intravenous contrast. Angiographic images of the neck were obtained using MRA technique without and with intravenous contrast. Carotid stenosis measurements (when applicable) are obtained utilizing NASCET criteria, using the distal internal carotid diameter as the denominator. CONTRAST:  8mL GADAVIST GADOBUTROL 1 MMOL/ML IV SOLN COMPARISON:   None. FINDINGS: MRI HEAD FINDINGS Brain: No acute infarct. Multifocal hemorrhagic contusion within the anterior left temporal lobe and both anterior frontal lobes. The most lateral component of contusion in the left frontal lobe has increased in size. There is subdural blood over the anterior left convexity and within the inter hemispheric fissure. No midline shift or other mass effect. No hydrocephalus. No abnormal contrast enhancement. Vascular: Major flow voids are preserved. Skull and upper cervical spine: Normal calvarium and skull base. Visualized upper cervical spine and soft tissues are normal. Sinuses/Orbits:No paranasal sinus fluid levels or advanced mucosal thickening. No mastoid or middle ear effusion. Normal orbits. MRA HEAD FINDINGS POSTERIOR CIRCULATION: --Vertebral arteries: Normal --Inferior cerebellar arteries: Normal. --Basilar artery: Normal. --Superior cerebellar arteries: Normal. --Posterior cerebral arteries: Normal. ANTERIOR CIRCULATION: --Intracranial internal carotid arteries: Normal. --Anterior cerebral arteries (ACA): Normal. --Middle cerebral arteries (MCA): Normal. ANATOMIC VARIANTS: None of significance MRA NECK FINDINGS Normal carotid and vertebral arteries. IMPRESSION: 1. Multifocal hemorrhagic contusion within the anterior left temporal lobe and both anterior frontal lobes. Lateral left frontal contusion is increased in size. 2. No midline shift or other mass effect. 3. Small volume subdural blood over the anterior left convexity and within the inter hemispheric fissure. 4. Normal MRA of the head and neck. Electronically Signed   By: Deatra RobinsonKevin  Herman M.D.   On: 05/13/2020 03:09   MR ANGIO NECK W WO CONTRAST  Result Date: 05/13/2020 CLINICAL DATA:  Syncope with intracranial hemorrhage. EXAM: MRI HEAD WITHOUT AND WITH CONTRAST MRA HEAD WITHOUT CONTRAST MRA NECK WITHOUT AND WITH CONTRAST TECHNIQUE: Multiplanar, multiecho pulse sequences of the brain and surrounding structures were  obtained without and with intravenous contrast. Angiographic images of the Circle of Willis were obtained using MRA technique without intravenous contrast. Angiographic images of the neck were obtained using MRA technique  without and with intravenous contrast. Carotid stenosis measurements (when applicable) are obtained utilizing NASCET criteria, using the distal internal carotid diameter as the denominator. CONTRAST:  67mL GADAVIST GADOBUTROL 1 MMOL/ML IV SOLN COMPARISON:  None. FINDINGS: MRI HEAD FINDINGS Brain: No acute infarct. Multifocal hemorrhagic contusion within the anterior left temporal lobe and both anterior frontal lobes. The most lateral component of contusion in the left frontal lobe has increased in size. There is subdural blood over the anterior left convexity and within the inter hemispheric fissure. No midline shift or other mass effect. No hydrocephalus. No abnormal contrast enhancement. Vascular: Major flow voids are preserved. Skull and upper cervical spine: Normal calvarium and skull base. Visualized upper cervical spine and soft tissues are normal. Sinuses/Orbits:No paranasal sinus fluid levels or advanced mucosal thickening. No mastoid or middle ear effusion. Normal orbits. MRA HEAD FINDINGS POSTERIOR CIRCULATION: --Vertebral arteries: Normal --Inferior cerebellar arteries: Normal. --Basilar artery: Normal. --Superior cerebellar arteries: Normal. --Posterior cerebral arteries: Normal. ANTERIOR CIRCULATION: --Intracranial internal carotid arteries: Normal. --Anterior cerebral arteries (ACA): Normal. --Middle cerebral arteries (MCA): Normal. ANATOMIC VARIANTS: None of significance MRA NECK FINDINGS Normal carotid and vertebral arteries. IMPRESSION: 1. Multifocal hemorrhagic contusion within the anterior left temporal lobe and both anterior frontal lobes. Lateral left frontal contusion is increased in size. 2. No midline shift or other mass effect. 3. Small volume subdural blood over the  anterior left convexity and within the inter hemispheric fissure. 4. Normal MRA of the head and neck. Electronically Signed   By: Deatra Robinson M.D.   On: 05/13/2020 03:09   MR Brain W and Wo Contrast  Result Date: 05/13/2020 CLINICAL DATA:  Syncope with intracranial hemorrhage. EXAM: MRI HEAD WITHOUT AND WITH CONTRAST MRA HEAD WITHOUT CONTRAST MRA NECK WITHOUT AND WITH CONTRAST TECHNIQUE: Multiplanar, multiecho pulse sequences of the brain and surrounding structures were obtained without and with intravenous contrast. Angiographic images of the Circle of Willis were obtained using MRA technique without intravenous contrast. Angiographic images of the neck were obtained using MRA technique without and with intravenous contrast. Carotid stenosis measurements (when applicable) are obtained utilizing NASCET criteria, using the distal internal carotid diameter as the denominator. CONTRAST:  83mL GADAVIST GADOBUTROL 1 MMOL/ML IV SOLN COMPARISON:  None. FINDINGS: MRI HEAD FINDINGS Brain: No acute infarct. Multifocal hemorrhagic contusion within the anterior left temporal lobe and both anterior frontal lobes. The most lateral component of contusion in the left frontal lobe has increased in size. There is subdural blood over the anterior left convexity and within the inter hemispheric fissure. No midline shift or other mass effect. No hydrocephalus. No abnormal contrast enhancement. Vascular: Major flow voids are preserved. Skull and upper cervical spine: Normal calvarium and skull base. Visualized upper cervical spine and soft tissues are normal. Sinuses/Orbits:No paranasal sinus fluid levels or advanced mucosal thickening. No mastoid or middle ear effusion. Normal orbits. MRA HEAD FINDINGS POSTERIOR CIRCULATION: --Vertebral arteries: Normal --Inferior cerebellar arteries: Normal. --Basilar artery: Normal. --Superior cerebellar arteries: Normal. --Posterior cerebral arteries: Normal. ANTERIOR CIRCULATION:  --Intracranial internal carotid arteries: Normal. --Anterior cerebral arteries (ACA): Normal. --Middle cerebral arteries (MCA): Normal. ANATOMIC VARIANTS: None of significance MRA NECK FINDINGS Normal carotid and vertebral arteries. IMPRESSION: 1. Multifocal hemorrhagic contusion within the anterior left temporal lobe and both anterior frontal lobes. Lateral left frontal contusion is increased in size. 2. No midline shift or other mass effect. 3. Small volume subdural blood over the anterior left convexity and within the inter hemispheric fissure. 4. Normal MRA of the head and neck. Electronically  Signed   By: Deatra Robinson M.D.   On: 05/13/2020 03:09   ECHOCARDIOGRAM COMPLETE  Result Date: 05/16/2020    ECHOCARDIOGRAM REPORT   Patient Name:   CLINTON DRAGONE [facility] Date of Exam: 05/16/2020 Medical Rec #:  [id number]     Height: Accession #:    [id number]    Weight: Date of Birth:  [birth year]     BSA: Patient Age:    24 years      BP:           159/92 mmHg Patient Gender: M             HR:           49 bpm. Exam Location:  [facility] Procedure: 2D Echo, Cardiac Doppler, Color Doppler and Strain Analysis Indications:     Stroke 434.91  History:         Patient has no prior history of Echocardiogram examinations. No                  medical hsistory on file.  Sonographer:     Cristela Blue RDCS (AE) Referring Phys:  [id number] Boots [place] Diagnosing Phys: Lorine Bears MD  Sonographer Comments: Global longitudinal strain was attempted. IMPRESSIONS  1. Left ventricular ejection fraction, by estimation, is 60 to 65%. The left ventricle has normal function. The left ventricle has no regional wall motion abnormalities. Left ventricular diastolic parameters were normal.  2. Right ventricular systolic function is normal. The right ventricular size is normal. Tricuspid regurgitation signal is inadequate for assessing PA pressure.  3. The mitral valve is normal in structure. No evidence of mitral valve regurgitation. No evidence of mitral  stenosis.  4. The aortic valve is normal in structure. Aortic valve regurgitation is not visualized. No aortic stenosis is present.  5. The inferior vena cava is normal in size with greater than 50% respiratory variability, suggesting right atrial pressure of 3 mmHg. Conclusion(s)/Recommendation(s): Normal biventricular function without evidence of hemodynamically significant valvular heart disease. FINDINGS  Left Ventricle: Left ventricular ejection fraction, by estimation, is 60 to 65%. The left ventricle has normal function. The left ventricle has no regional wall motion abnormalities. The left ventricular internal cavity size was normal in size. There is  no left ventricular hypertrophy. Left ventricular diastolic parameters were normal. Right Ventricle: The right ventricular size is normal. No increase in right ventricular wall thickness. Right ventricular systolic function is normal. Tricuspid regurgitation signal is inadequate for assessing PA pressure. Left Atrium: Left atrial size was normal in size. Right Atrium: Right atrial size was normal in size. Pericardium: There is no evidence of pericardial effusion. Mitral Valve: The mitral valve is normal in structure. No evidence of mitral valve regurgitation. No evidence of mitral valve stenosis. Tricuspid Valve: The tricuspid valve is normal in structure. Tricuspid valve regurgitation is not demonstrated. No evidence of tricuspid stenosis. Aortic Valve: The aortic valve is normal in structure. Aortic valve regurgitation is not visualized. No aortic stenosis is present. Aortic valve mean gradient measures 3.0 mmHg. Aortic valve peak gradient measures 6.2 mmHg. Aortic valve area, by VTI measures 2.42 cm. Pulmonic Valve: The pulmonic valve was normal in structure. Pulmonic valve regurgitation is trivial. No evidence of pulmonic stenosis. Aorta: The aortic root is normal in size and structure. Venous: The inferior vena cava is normal in size with greater than 50%  respiratory variability, suggesting right atrial pressure of 3 mmHg. IAS/Shunts: No atrial level shunt detected by color flow Doppler.  LEFT  VENTRICLE PLAX 2D LVIDd:         5.00 cm  Diastology LVIDs:         2.70 cm  LV e' medial:    0.12 cm/s LV PW:         1.00 cm  LV E/e' medial:  10.0 LV IVS:        0.60 cm  LV e' lateral:   0.17 cm/s LVOT diam:     2.20 cm  LV E/e' lateral: 7.3 LV SV:         68 LVOT Area:     3.80 cm                          3D Volume EF:                         3D EF:        67 %                         LV EDV:       162 ml                         LV ESV:       53 ml                         LV SV:        109 ml RIGHT VENTRICLE RV Basal diam:  3.60 cm RV S prime:     17.20 cm/s TAPSE (M-mode): 3.7 cm LEFT ATRIUM           RIGHT ATRIUM LA diam:      2.70 cm RA Area:     14.00 cm LA Vol (A4C): 38.5 ml RA Volume:   38.30 ml  AORTIC VALVE                   PULMONIC VALVE AV Area (Vmax):    2.18 cm    PV Vmax:        0.85 m/s AV Area (Vmean):   2.15 cm    PV Peak grad:   2.9 mmHg AV Area (VTI):     2.42 cm    RVOT Peak grad: 4 mmHg AV Vmax:           125.00 cm/s AV Vmean:          83.600 cm/s AV VTI:            0.283 m AV Peak Grad:      6.2 mmHg AV Mean Grad:      3.0 mmHg LVOT Vmax:         71.80 cm/s LVOT Vmean:        47.200 cm/s LVOT VTI:          0.180 m LVOT/AV VTI ratio: 0.64  AORTA Ao Root diam: 2.80 cm MITRAL VALVE MV Area (PHT): 5.66 cm    SHUNTS MV Decel Time: 134 msec    Systemic VTI:  0.18 m MV E velocity: 1.24 cm/s   Systemic Diam: 2.20 cm MV A velocity: 89.10 cm/s MV E/A ratio:  0.01 Lorine Bears MD Electronically signed by Lorine Bears MD Signature Date/Time: 05/16/2020/11:45:15 AM    Final        Subjective: Patient seen and examined the bedside this morning.  Hemodynamically stable for discharge  Discharge Exam: Vitals:   05/16/20 0636 05/16/20 0740  BP: (!) 159/100 (!) 159/92  Pulse: (!) 45 (!) 49  Resp: 18 19  Temp: 98.8 F (37.1 C) 98.6 F (37 C)   SpO2: 97% 100%   Vitals:   05/15/20 2001 05/15/20 2354 05/16/20 0636 05/16/20 0740  BP: (!) 139/97 (!) 161/95 (!) 159/100 (!) 159/92  Pulse: (!) 46 (!) 42 (!) 45 (!) 49  Resp: 18 18 18 19   Temp: 98.4 F (36.9 C) 98.6 F (37 C) 98.8 F (37.1 C) 98.6 F (37 C)  TempSrc: Oral Oral Oral   SpO2: 99% 98% 97% 100%  Weight:      Height:        General: Pt is alert, awake, not in acute distress Cardiovascular: RRR, S1/S2 +, no rubs, no gallops Respiratory: CTA bilaterally, no wheezing, no rhonchi Abdominal: Soft, NT, ND, bowel sounds + Extremities: no edema, no cyanosis    The results of significant diagnostics from this hospitalization (including imaging, microbiology, ancillary and laboratory) are listed below for reference.     Microbiology: Recent Results (from the past 240 hour(s))  SARS CORONAVIRUS 2 (TAT 6-24 HRS) Nasopharyngeal Nasopharyngeal Swab     Status: None   Collection Time: 05/12/20 11:42 PM   Specimen: Nasopharyngeal Swab  Result Value Ref Range Status   SARS Coronavirus 2 NEGATIVE NEGATIVE Final    Comment: (NOTE) SARS-CoV-2 target nucleic acids are NOT DETECTED.  The SARS-CoV-2 RNA is generally detectable in upper and lower respiratory specimens during the acute phase of infection. Negative results do not preclude SARS-CoV-2 infection, do not rule out co-infections with other pathogens, and should not be used as the sole basis for treatment or other patient management decisions. Negative results must be combined with clinical observations, patient history, and epidemiological information. The expected result is Negative.  Fact Sheet for Patients: HairSlick.no  Fact Sheet for Healthcare Providers: quierodirigir.com  This test is not yet approved or cleared by the [place] FDA and  has been authorized for detection and/or diagnosis of SARS-CoV-2 by FDA under an Emergency Use Authorization  (EUA). This EUA will remain  in effect (meaning this test can be used) for the duration of the COVID-19 declaration under Se ction 564(b)(1) of the Act, 21 U.S.C. section 360bbb-3(b)(1), unless the authorization is terminated or revoked sooner.  Performed at [facility], [address] [place], [place] [address]      Labs: BNP (last 3 results) No results for input(s): BNP in the last 8760 hours. Basic Metabolic Panel: Recent Labs  Lab 05/12/20 1624 05/13/20 0343 05/14/20 0436 05/15/20 0409 05/16/20 0747  NA 139 138 138 139 140  K 2.8* 4.7 4.2 4.0 3.9  CL 102 106 106 106 109  CO2 24 21* 23 25 24   GLUCOSE 165* 121* 113* 85 83  BUN 11 17 23* 18 12  CREATININE 1.13 2.29* 2.51* 1.90* 1.47*  CALCIUM 9.1 9.2 8.9 8.8* 8.4*  MG 1.9 2.8*  --   --   --   PHOS 2.2*  --   --   --   --    Liver Function Tests: Recent Labs  Lab 05/13/20 0343  AST 40  40  ALT 28  29  ALKPHOS 48  48  BILITOT 1.9*  1.9*  PROT 7.8  7.7  ALBUMIN 4.7  4.8   No results for input(s): LIPASE, AMYLASE in the last 168 hours. No results for input(s):  AMMONIA in the last 168 hours. CBC: Recent Labs  Lab 05/12/20 1624 05/13/20 0343 05/14/20 0436  WBC 12.2* 13.0* 9.9  NEUTROABS  --  11.9* 8.3*  HGB 14.9 14.3 11.8*  HCT 41.3 40.6 32.8*  MCV 81.0 81.7 81.6  PLT 242 160 121*   Cardiac Enzymes: Recent Labs  Lab 05/14/20 0024 05/14/20 0436 05/15/20 0409 05/15/20 1401 05/16/20 0747  CKTOTAL 1,158* 1,146* 3,482* 4,268* 4,589*   BNP: Invalid input(s): POCBNP CBG: No results for input(s): GLUCAP in the last 168 hours. D-Dimer No results for input(s): DDIMER in the last 72 hours. Hgb A1c No results for input(s): HGBA1C in the last 72 hours. Lipid Profile No results for input(s): CHOL, HDL, LDLCALC, TRIG, CHOLHDL, LDLDIRECT in the last 72 hours. Thyroid function studies Recent Labs    05/16/20 0747  TSH 2.644   Anemia work up No results for input(s): VITAMINB12, FOLATE,  FERRITIN, TIBC, IRON, RETICCTPCT in the last 72 hours. Urinalysis    Component Value Date/Time   COLORURINE YELLOW (A) 05/14/2020 0436   APPEARANCEUR HAZY (A) 05/14/2020 0436   LABSPEC 1.010 05/14/2020 0436   PHURINE 5.0 05/14/2020 0436   GLUCOSEU NEGATIVE 05/14/2020 0436   HGBUR LARGE (A) 05/14/2020 0436   BILIRUBINUR NEGATIVE 05/14/2020 0436   KETONESUR 5 (A) 05/14/2020 0436   PROTEINUR NEGATIVE 05/14/2020 0436   NITRITE NEGATIVE 05/14/2020 0436   LEUKOCYTESUR NEGATIVE 05/14/2020 0436   Sepsis Labs Invalid input(s): PROCALCITONIN,  WBC,  LACTICIDVEN Microbiology Recent Results (from the past 240 hour(s))  SARS CORONAVIRUS 2 (TAT 6-24 HRS) Nasopharyngeal Nasopharyngeal Swab     Status: None   Collection Time: 05/12/20 11:42 PM   Specimen: Nasopharyngeal Swab  Result Value Ref Range Status   SARS Coronavirus 2 NEGATIVE NEGATIVE Final    Comment: (NOTE) SARS-CoV-2 target nucleic acids are NOT DETECTED.  The SARS-CoV-2 RNA is generally detectable in upper and lower respiratory specimens during the acute phase of infection. Negative results do not preclude SARS-CoV-2 infection, do not rule out co-infections with other pathogens, and should not be used as the sole basis for treatment or other patient management decisions. Negative results must be combined with clinical observations, patient history, and epidemiological information. The expected result is Negative.  Fact Sheet for Patients: HairSlick.nohttps://www.fda.gov/media/138098/download  Fact Sheet for Healthcare Providers: quierodirigir.comhttps://www.fda.gov/media/138095/download  This test is not yet approved or cleared by the [place] FDA and  has been authorized for detection and/or diagnosis of SARS-CoV-2 by FDA under an Emergency Use Authorization (EUA). This EUA will remain  in effect (meaning this test can be used) for the duration of the COVID-19 declaration under Se ction 564(b)(1) of the Act, 21 U.S.C. section 360bbb-3(b)(1),  unless the authorization is terminated or revoked sooner.  Performed at [facility], [address] [place], [place] [id number]     Please note: You were cared for by a hospitalist during your hospital stay. Once you are discharged, your primary care physician will handle any further medical issues. Please note that NO REFILLS for any discharge medications will be authorized once you are discharged, as it is imperative that you return to your primary care physician (or establish a relationship with a primary care physician if you do not have one) for your post hospital discharge needs so that they can reassess your need for medications and monitor your lab values.    Time coordinating discharge: 40 minutes  SIGNED:   Burnadette PopAmrit Deantre Bourdon, MD  [facility] 05/16/2020, 1:17 PM Pager [phone]  If 7PM-7AM, please contact night-coverage [email or web address] Password [email or web address]

## 2020-05-16 NOTE — Consult Note (Addendum)
Cardiology Consultation:   Patient ID: OTILIO GROLEAU MRN: [id number]; DOB: 27-May-1995  Admit date: 05/12/2020 Date of Consult: 05/16/2020  Primary Care Provider: Ronnette Juniper, MD [facility] Cardiologist: [facility], Dr. C[facility] Electrophysiologist:  None    Patient Profile:   TIRAS BIANCHINI is a 25 y.o. male with no significant past medical history of cardiac disease and who is being seen today for the evaluation of bradycardia at the request of Dr. Renford Dills.  History of Present Illness:   Mr. Hora is a 25 year old male with no previously known PMH or history of heart disease / arrhythmia. He denies any h/o DM2, hypertension, or seizure.  He denies any current drug use or alcohol use. He denies any home medications.  He reports he used to be athletic and played lacrosse but does not currently do anything for physical activity. He denies any family history of heart disease, arrhythmia, or early cardiac death.    He does have a history of syncope s/p blood draws/needles, described as consistent with vasovagal etiology.  He also reports a history of anxiety, especially surrounding needles.  He reports being in his usual state of health 05/12/2020 when he presented to his [facility] PCP for routine health checkup and blood work.  He states this morning that he does not remember anything from the entire physician visit.  He is accompanied by his family today that reports he had his blood drawn and then was checking out when he told the nurse that he did not feel well.  He then passed out and hit his head.  He was confused after this event and did not remember anything leading up to it.   He was sent from [facility] clinic to [facility] ED. Per RN notes, he presented to [facility] ED with emesis and nausea, which pt states he does not remember occurring today. He denies recurrence of  Any emesis or nausea since that time. Documentation indicates a witnessed seizure that occurred in the emergency  department waiting room that the pt is unable to recall today.  In the emergency department, initial vitals significant for elevated BP 168/113, HR 85 bpm, 97% ORA. EKG without acute ST/T changes. Initial EKG NSR with PACs, RAD, and incomplete RBBB versus print out of AT. Subsequent EKGs showed ST with RAD and incomplete RBBB and SB with resolution of incomplete RBBB / RAD and nonspecific ST/T changes.  Initial labs significant for hypokalemia and mild AKI with K 2.8, Cr 1.13, BUN 11, Na 139, Mg 1.9, Ck 115, WBC 12.2, Hgb 14.9, HCT 41.3, Plts 242.  CT head showed 3 mm interhemispheric subdural hematoma anteriorly / small hemorrhagic contusion of the left inferior frontal lobe, as well as nondisplaced fracture of the right occipital lobe.    Per ED MD, he was initially minimally responsive to painful stimuli with gaze deviation but eventually became more responsive and slightly agitated.  Neurology evaluated via telemedicine and recommended Keppra for 1 week, as well as Ativan to help with his anxiety. Today, 05/16/2020, he denies any history of chest pain or palpitations.  As above, he denies any nausea or emesis. His history is limited as he states poor memory surrounding anything leading up to this morning. He is joined in the room by his father, who is unable to add much more history other than to state the pt has a fear of needles. RN notes indicate chest pain yesterday that the pt is unable to describe today and states has  not recurred. He does report presyncope/syncope surrounding needles as above.  No history of lower extremity edema or s/sx consistent with heart failure/volume overload.  No s/sx consistent with bleeding. He reports he wants to go home.  Cardiology consulted for SB, which on review of EKG, occurred at 02:57AM and telemetry at 19:25.   History reviewed. No pertinent past medical history.  History reviewed. No pertinent surgical history.   Home Medications:  Prior to Admission  medications   Medication Sig Start Date End Date Taking? Authorizing Provider  meloxicam (MOBIC) 15 MG tablet Take 1 tablet (15 mg total) by mouth daily. Patient not taking: Reported on 05/13/2020 07/15/15   Cuthriell, Charline Bills, PA-C    Inpatient Medications: Scheduled Meds: . acetaminophen  1,000 mg Oral Q8H   Or  . acetaminophen  650 mg Rectal Q8H  . lacosamide  100 mg Oral Q12H  . nicotine  14 mg Transdermal Daily  . potassium chloride  40 mEq Oral Once   Continuous Infusions: . sodium chloride 250 mL/hr at 05/15/20 2215  . lacosamide (VIMPAT) IV Stopped (05/13/20 2330)   PRN Meds: ALPRAZolam, ondansetron **OR** ondansetron (ZOFRAN) IV, polyethylene glycol  Allergies:   No Known Allergies  Social History:   Social History   Socioeconomic History  . Marital status: Single    Spouse name: Not on file  . Number of children: Not on file  . Years of education: Not on file  . Highest education level: Not on file  Occupational History  . Not on file  Tobacco Use  . Smoking status: Never Smoker  . Smokeless tobacco: Never Used  Substance and Sexual Activity  . Alcohol use: No  . Drug use: No  . Sexual activity: Not on file  Other Topics Concern  . Not on file  Social History Narrative  . Not on file   Social Determinants of Health   Financial Resource Strain: Not on file  Food Insecurity: Not on file  Transportation Needs: Not on file  Physical Activity: Not on file  Stress: Not on file  Social Connections: Not on file  Intimate Partner Violence: Not on file    Family History:   No family history of heart disease or arrhythmia Family History  Problem Relation Age of Onset  . Other Neg Hx      ROS:  Please see the history of present illness.  Review of Systems  Constitutional: Positive for malaise/fatigue.       Did not sleep well - fatigue  Respiratory: Negative for cough, hemoptysis and shortness of breath.   Cardiovascular: Positive for chest pain.  Negative for palpitations, orthopnea, claudication and leg swelling.  Gastrointestinal: Negative for abdominal pain, blood in stool, constipation, diarrhea, melena, nausea and vomiting.       RN reports emesis and nausea but pt does not recall  Musculoskeletal: Positive for falls.  Neurological: Positive for loss of consciousness. Negative for focal weakness.  Psychiatric/Behavioral:       Anxiety surrounding needles   All other systems reviewed and are negative.   All other ROS reviewed and negative.     Physical Exam/Data:   Vitals:   05/15/20 2001 05/15/20 2354 05/16/20 0636 05/16/20 0740  BP: (!) 139/97 (!) 161/95 (!) 159/100 (!) 159/92  Pulse: (!) 46 (!) 42 (!) 45 (!) 49  Resp: 18 18 18 19   Temp: 98.4 F (36.9 C) 98.6 F (37 C) 98.8 F (37.1 C) 98.6 F (37 C)  TempSrc: Oral Oral  Oral   SpO2: 99% 98% 97% 100%  Weight:      Height:       No intake or output data in the 24 hours ending 05/16/20 0759 Last 3 Weights 05/12/2020 07/15/2015  Weight (lbs) 185 lb 175 lb  Weight (kg) 83.915 kg 79.379 kg     Body mass index is 23.75 kg/m.  General:  Well nourished, well developed, in no acute distress.  Joined by family. HEENT: normal Lymph: no adenopathy Neck: no JVD Endocrine:  No thryomegaly Vascular: No carotid bruits; FA pulses 2+ bilaterally without bruits  Cardiac:  normal S1, S2; RRR; no murmur  Lungs:  clear to auscultation bilaterally, no wheezing, rhonchi or rales  Abd: soft, nontender, no hepatomegaly  Ext: no edema Musculoskeletal:  No deformities, BUE and BLE strength normal and equal Skin: warm and dry  Neuro:  CNs 2-12 intact, no focal abnormalities noted Psych:  Normal affect   EKG:  The EKG was personally reviewed and demonstrates:initial EKG sinus arrhythmia /NSR with PACs verus AT, RAD, and incomplete RBBB; ST 100bpm with incomplete RBBB; sinus bradycardia at 42bpm Telemetry:  Telemetry was personally reviewed and demonstrates: NSR, ST, SB 40- low  100s  Relevant CV Studies: Pending echo  Laboratory Data:  High Sensitivity Troponin:  No results for input(s): TROPONINIHS in the last 720 hours.   Chemistry Recent Labs  Lab 05/13/20 0343 05/14/20 0436 05/15/20 0409  NA 138 138 139  K 4.7 4.2 4.0  CL 106 106 106  CO2 21* 23 25  GLUCOSE 121* 113* 85  BUN 17 23* 18  CREATININE 2.29* 2.51* 1.90*  CALCIUM 9.2 8.9 8.8*  GFRNONAA 40* 36* 51*  ANIONGAP 11 9 8     Recent Labs  Lab 05/13/20 0343  PROT 7.8  7.7  ALBUMIN 4.7  4.8  AST 40  40  ALT 28  29  ALKPHOS 48  48  BILITOT 1.9*  1.9*   Hematology Recent Labs  Lab 05/12/20 1624 05/13/20 0343 05/14/20 0436  WBC 12.2* 13.0* 9.9  RBC 5.10 4.97 4.02*  HGB 14.9 14.3 11.8*  HCT 41.3 40.6 32.8*  MCV 81.0 81.7 81.6  MCH 29.2 28.8 29.4  MCHC 36.1* 35.2 36.0  RDW 12.1 12.0 12.4  PLT 242 160 121*   BNPNo results for input(s): BNP, PROBNP in the last 168 hours.  DDimer No results for input(s): DDIMER in the last 168 hours.   Radiology/Studies:  CT HEAD WO CONTRAST  Result Date: 05/15/2020 CLINICAL DATA:  25 year old male status post fall with intracranial hemorrhage. Seizure. Nausea. EXAM: CT HEAD WITHOUT CONTRAST TECHNIQUE: Contiguous axial images were obtained from the base of the skull through the vertex without intravenous contrast. COMPARISON:  Brain MRI and MRA head and neck 05/13/2020. head CTs 05/12/2020 and earlier. FINDINGS: Brain: Trace bilateral subdural hematoma has been better demonstrated by MRI. Small volume subdural blood along the falx has largely resolved since 05/12/2020. No intraventricular hemorrhage or ventriculomegaly. Bilateral inferior frontal gyrus hemorrhagic contusions are more apparent since 05/12/2020 (series 2, image 8) but without significant mass effect. Regional edema not significantly changed from MRI. Anterior temporal lobes largely spared as before. No midline shift. Normal basilar cisterns. No new intracranial hemorrhage identified.  No cortically based acute infarct identified. Stable gray-white matter differentiation elsewhere. Vascular: No suspicious intracranial vascular hyperdensity. Skull: Nondisplaced medial right occipital bone fracture which tracks to the posterior rim of the foramen magnum (series 8, image 3). No new osseous abnormality identified. Sinuses/Orbits: Decreased fluid  levels in the sphenoid sinuses. Other Visualized paranasal sinuses and mastoids are clear. Other: Visible orbit and normal limits scalp soft. Tissues remain within IMPRESSION: 1. Bilateral inferior frontal gyrus hemorrhagic contusions not significantly changed from recent MRI. Mild edema with no significant mass effect. 2. Trace bilateral subdural hematomas largely occult by CT. Subdural blood along the falx has largely resolved. 3. Stable nondisplaced right occipital bone fracture to the foramen magnum. 4. No new intracranial abnormality. Electronically Signed   By: Odessa Fleming M.D.   On: 05/15/2020 09:38   CT Head Wo Contrast  Result Date: 05/12/2020 CLINICAL DATA:  Fall.  Re-evaluate bleed EXAM: CT HEAD WITHOUT CONTRAST CT CERVICAL SPINE WITHOUT CONTRAST TECHNIQUE: Multidetector CT imaging of the head and cervical spine was performed following the standard protocol without intravenous contrast. Multiplanar CT image reconstructions of the cervical spine were also generated. COMPARISON:  CT head 05/12/2020 FINDINGS: CT HEAD FINDINGS Brain: Subdural hematoma again demonstrated along the left side of the falx measuring about 5 mm maximal depth. Additional parenchymal contusion demonstrated in the left inferior frontal lobe. Mild progression in the size and conspicuity of the left inferior frontal lobe hemorrhage since previous study. No new hemorrhage. No significant mass effect or midline shift. No ventricular dilatation. Gray-white matter junctions are distinct. Vascular: No acute abnormalities. Skull: Nondepressed basal skull fractures again demonstrated  with fracture lines extending to the foramen magnum. Sinuses/Orbits: Air-fluid levels in the sphenoid sinus. Mastoid air cells are clear. Other: Note that the CT head was apparently reconstructed with images flipped right to left with respect to the typical image orientation. CT CERVICAL SPINE FINDINGS Alignment: Straightening of the usual cervical lordosis and cervical scoliosis convex towards the right without anterior subluxation. This is likely positional but muscle spasm could also have this appearance. Normal alignment of the posterior elements. C1-2 articulation appears intact. Skull base and vertebrae: Right basal occipital skull fractures again demonstrated. Soft tissues and spinal canal: No prevertebral soft tissue swelling. No abnormal paraspinal soft tissue mass or infiltration. Disc levels:  Intervertebral disc space heights are preserved. Upper chest: Visualized lung apices are clear. Other: None. IMPRESSION: 1. Subdural hematoma again demonstrated along the left side of the falx measuring about 5 mm maximal depth. No change. 2. Parenchymal contusions in the left inferior frontal lobe with mild progression in size and conspicuity since previous study. No significant mass effect or midline shift. 3. Nondepressed basal skull fractures again demonstrated. 4. Nonspecific straightening of usual cervical lordosis and cervical scoliosis convex towards the right without anterior subluxation. This is likely positional but muscle spasm could also have this appearance. 5. No acute displaced fractures identified in the cervical spine. Electronically Signed   By: Burman Nieves M.D.   On: 05/12/2020 23:54   CT HEAD WO CONTRAST  Result Date: 05/12/2020 CLINICAL DATA:  Syncope.  Fall with head injury EXAM: CT HEAD WITHOUT CONTRAST TECHNIQUE: Contiguous axial images were obtained from the base of the skull through the vertex without intravenous contrast. COMPARISON:  None. FINDINGS: Brain: Acute  interhemispheric subdural hematoma anteriorly measuring approximately 3 mm in thickness. Additional small area of hemorrhagic contusion in the left inferior frontal lobe measuring approximately 5 mm. No subarachnoid hemorrhage. Ventricle size normal.  Negative for acute infarct or mass lesion. Vascular: Negative for hyperdense vessel Skull: Right occipital skull fracture, acute appearing. Nondisplaced fracture. Sinuses/Orbits: Small air-fluid level in the sphenoid sinus. No skull base fracture identified. Negative orbit Other: None IMPRESSION: 3 mm interhemispheric subdural hematoma anteriorly. Small  hemorrhagic contusion left inferior frontal lobe Nondisplaced fracture right occipital bone. These results were called by telephone at the time of interpretation on 05/12/2020 at 4:50 pm to provider Katrinka BlazingSmith, who verbally acknowledged these results. Electronically Signed   By: Marlan Palauharles  Clark M.D.   On: 05/12/2020 16:50   CT Cervical Spine Wo Contrast  Result Date: 05/12/2020 CLINICAL DATA:  Fall.  Re-evaluate bleed EXAM: CT HEAD WITHOUT CONTRAST CT CERVICAL SPINE WITHOUT CONTRAST TECHNIQUE: Multidetector CT imaging of the head and cervical spine was performed following the standard protocol without intravenous contrast. Multiplanar CT image reconstructions of the cervical spine were also generated. COMPARISON:  CT head 05/12/2020 FINDINGS: CT HEAD FINDINGS Brain: Subdural hematoma again demonstrated along the left side of the falx measuring about 5 mm maximal depth. Additional parenchymal contusion demonstrated in the left inferior frontal lobe. Mild progression in the size and conspicuity of the left inferior frontal lobe hemorrhage since previous study. No new hemorrhage. No significant mass effect or midline shift. No ventricular dilatation. Gray-white matter junctions are distinct. Vascular: No acute abnormalities. Skull: Nondepressed basal skull fractures again demonstrated with fracture lines extending to the  foramen magnum. Sinuses/Orbits: Air-fluid levels in the sphenoid sinus. Mastoid air cells are clear. Other: Note that the CT head was apparently reconstructed with images flipped right to left with respect to the typical image orientation. CT CERVICAL SPINE FINDINGS Alignment: Straightening of the usual cervical lordosis and cervical scoliosis convex towards the right without anterior subluxation. This is likely positional but muscle spasm could also have this appearance. Normal alignment of the posterior elements. C1-2 articulation appears intact. Skull base and vertebrae: Right basal occipital skull fractures again demonstrated. Soft tissues and spinal canal: No prevertebral soft tissue swelling. No abnormal paraspinal soft tissue mass or infiltration. Disc levels:  Intervertebral disc space heights are preserved. Upper chest: Visualized lung apices are clear. Other: None. IMPRESSION: 1. Subdural hematoma again demonstrated along the left side of the falx measuring about 5 mm maximal depth. No change. 2. Parenchymal contusions in the left inferior frontal lobe with mild progression in size and conspicuity since previous study. No significant mass effect or midline shift. 3. Nondepressed basal skull fractures again demonstrated. 4. Nonspecific straightening of usual cervical lordosis and cervical scoliosis convex towards the right without anterior subluxation. This is likely positional but muscle spasm could also have this appearance. 5. No acute displaced fractures identified in the cervical spine. Electronically Signed   By: Burman NievesWilliam  Stevens M.D.   On: 05/12/2020 23:54   MR ANGIO HEAD WO CONTRAST  Result Date: 05/13/2020 CLINICAL DATA:  Syncope with intracranial hemorrhage. EXAM: MRI HEAD WITHOUT AND WITH CONTRAST MRA HEAD WITHOUT CONTRAST MRA NECK WITHOUT AND WITH CONTRAST TECHNIQUE: Multiplanar, multiecho pulse sequences of the brain and surrounding structures were obtained without and with intravenous  contrast. Angiographic images of the Circle of Willis were obtained using MRA technique without intravenous contrast. Angiographic images of the neck were obtained using MRA technique without and with intravenous contrast. Carotid stenosis measurements (when applicable) are obtained utilizing NASCET criteria, using the distal internal carotid diameter as the denominator. CONTRAST:  8mL GADAVIST GADOBUTROL 1 MMOL/ML IV SOLN COMPARISON:  None. FINDINGS: MRI HEAD FINDINGS Brain: No acute infarct. Multifocal hemorrhagic contusion within the anterior left temporal lobe and both anterior frontal lobes. The most lateral component of contusion in the left frontal lobe has increased in size. There is subdural blood over the anterior left convexity and within the inter hemispheric fissure. No midline shift  or other mass effect. No hydrocephalus. No abnormal contrast enhancement. Vascular: Major flow voids are preserved. Skull and upper cervical spine: Normal calvarium and skull base. Visualized upper cervical spine and soft tissues are normal. Sinuses/Orbits:No paranasal sinus fluid levels or advanced mucosal thickening. No mastoid or middle ear effusion. Normal orbits. MRA HEAD FINDINGS POSTERIOR CIRCULATION: --Vertebral arteries: Normal --Inferior cerebellar arteries: Normal. --Basilar artery: Normal. --Superior cerebellar arteries: Normal. --Posterior cerebral arteries: Normal. ANTERIOR CIRCULATION: --Intracranial internal carotid arteries: Normal. --Anterior cerebral arteries (ACA): Normal. --Middle cerebral arteries (MCA): Normal. ANATOMIC VARIANTS: None of significance MRA NECK FINDINGS Normal carotid and vertebral arteries. IMPRESSION: 1. Multifocal hemorrhagic contusion within the anterior left temporal lobe and both anterior frontal lobes. Lateral left frontal contusion is increased in size. 2. No midline shift or other mass effect. 3. Small volume subdural blood over the anterior left convexity and within the inter  hemispheric fissure. 4. Normal MRA of the head and neck. Electronically Signed   By: Deatra Robinson M.D.   On: 05/13/2020 03:09   MR ANGIO NECK W WO CONTRAST  Result Date: 05/13/2020 CLINICAL DATA:  Syncope with intracranial hemorrhage. EXAM: MRI HEAD WITHOUT AND WITH CONTRAST MRA HEAD WITHOUT CONTRAST MRA NECK WITHOUT AND WITH CONTRAST TECHNIQUE: Multiplanar, multiecho pulse sequences of the brain and surrounding structures were obtained without and with intravenous contrast. Angiographic images of the Circle of Willis were obtained using MRA technique without intravenous contrast. Angiographic images of the neck were obtained using MRA technique without and with intravenous contrast. Carotid stenosis measurements (when applicable) are obtained utilizing NASCET criteria, using the distal internal carotid diameter as the denominator. CONTRAST:  78mL GADAVIST GADOBUTROL 1 MMOL/ML IV SOLN COMPARISON:  None. FINDINGS: MRI HEAD FINDINGS Brain: No acute infarct. Multifocal hemorrhagic contusion within the anterior left temporal lobe and both anterior frontal lobes. The most lateral component of contusion in the left frontal lobe has increased in size. There is subdural blood over the anterior left convexity and within the inter hemispheric fissure. No midline shift or other mass effect. No hydrocephalus. No abnormal contrast enhancement. Vascular: Major flow voids are preserved. Skull and upper cervical spine: Normal calvarium and skull base. Visualized upper cervical spine and soft tissues are normal. Sinuses/Orbits:No paranasal sinus fluid levels or advanced mucosal thickening. No mastoid or middle ear effusion. Normal orbits. MRA HEAD FINDINGS POSTERIOR CIRCULATION: --Vertebral arteries: Normal --Inferior cerebellar arteries: Normal. --Basilar artery: Normal. --Superior cerebellar arteries: Normal. --Posterior cerebral arteries: Normal. ANTERIOR CIRCULATION: --Intracranial internal carotid arteries: Normal.  --Anterior cerebral arteries (ACA): Normal. --Middle cerebral arteries (MCA): Normal. ANATOMIC VARIANTS: None of significance MRA NECK FINDINGS Normal carotid and vertebral arteries. IMPRESSION: 1. Multifocal hemorrhagic contusion within the anterior left temporal lobe and both anterior frontal lobes. Lateral left frontal contusion is increased in size. 2. No midline shift or other mass effect. 3. Small volume subdural blood over the anterior left convexity and within the inter hemispheric fissure. 4. Normal MRA of the head and neck. Electronically Signed   By: Deatra Robinson M.D.   On: 05/13/2020 03:09   MR Brain W and Wo Contrast  Result Date: 05/13/2020 CLINICAL DATA:  Syncope with intracranial hemorrhage. EXAM: MRI HEAD WITHOUT AND WITH CONTRAST MRA HEAD WITHOUT CONTRAST MRA NECK WITHOUT AND WITH CONTRAST TECHNIQUE: Multiplanar, multiecho pulse sequences of the brain and surrounding structures were obtained without and with intravenous contrast. Angiographic images of the Circle of Willis were obtained using MRA technique without intravenous contrast. Angiographic images of the neck were obtained using MRA  technique without and with intravenous contrast. Carotid stenosis measurements (when applicable) are obtained utilizing NASCET criteria, using the distal internal carotid diameter as the denominator. CONTRAST:  8mL GADAVIST GADOBUTROL 1 MMOL/ML IV SOLN COMPARISON:  None. FINDINGS: MRI HEAD FINDINGS Brain: No acute infarct. Multifocal hemorrhagic contusion within the anterior left temporal lobe and both anterior frontal lobes. The most lateral component of contusion in the left frontal lobe has increased in size. There is subdural blood over the anterior left convexity and within the inter hemispheric fissure. No midline shift or other mass effect. No hydrocephalus. No abnormal contrast enhancement. Vascular: Major flow voids are preserved. Skull and upper cervical spine: Normal calvarium and skull base.  Visualized upper cervical spine and soft tissues are normal. Sinuses/Orbits:No paranasal sinus fluid levels or advanced mucosal thickening. No mastoid or middle ear effusion. Normal orbits. MRA HEAD FINDINGS POSTERIOR CIRCULATION: --Vertebral arteries: Normal --Inferior cerebellar arteries: Normal. --Basilar artery: Normal. --Superior cerebellar arteries: Normal. --Posterior cerebral arteries: Normal. ANTERIOR CIRCULATION: --Intracranial internal carotid arteries: Normal. --Anterior cerebral arteries (ACA): Normal. --Middle cerebral arteries (MCA): Normal. ANATOMIC VARIANTS: None of significance MRA NECK FINDINGS Normal carotid and vertebral arteries. IMPRESSION: 1. Multifocal hemorrhagic contusion within the anterior left temporal lobe and both anterior frontal lobes. Lateral left frontal contusion is increased in size. 2. No midline shift or other mass effect. 3. Small volume subdural blood over the anterior left convexity and within the inter hemispheric fissure. 4. Normal MRA of the head and neck. Electronically Signed   By: Deatra RobinsonKevin  Herman M.D.   On: 05/13/2020 03:09     Assessment and Plan:   Sinus Bradycardia --Admission for syncopal event in the setting of needle anxiety as described above.  Syncope most consistent with vasovagal etiology per patient description and neurology evaluation.  Since presenting to the ED, patient has been diagnosed with subdural hematoma and seizure-like activity, placed on both Keppra and Ativan.   --Cardiology consulted for bradycardia, which occurred following admission on telemetry and in the overnight hours at which time the patient reports he was asleep.  Review of all telemetry significant for NSR-ST, 40s to low 100s. Suspect bradycardia in the setting of sleeping and ativan.   --Continue to monitor electrolytes / daily BMET.  Repeat K with goal 4.0, given hypokalemia at presentation. Check TSH. Given earlier syncopal event, reasonable to discharge with cardiac  monitoring, utilizing Zio AT, and to rule out arrhythmia. Follow-up in the office after Zio monitor resulted.  Syncope --Reports a syncopal event that occurred in the setting of needle anxiety.  Most consistent with vasovagal etiology in setting of bloodwork on review of chart and HPI as patient can recall, given his memory is limited of this event. Also considered is significant hypokalemia at presentation. Per RN notes, emesis also reported, though pt is unable to recall.  Seizure like activity also reported in the ED waiting room though not recurring since that time.  Recommendations per neurology for Keppra and Ativan. Telemetry without significant arrhythmia and SB observed overnight while sleeping and in the setting of Ativan use. As above, replete electrolytes and reasonable to discharge with Zio AT monitor further cardiac monitoring and to rule out arrhythmia.  Subdural hematoma --Recommendations per neurology.  Seizure like activity --Will monitor electrolytes and telemetry. Check TSH. Further recommendations per neurology.           For questions or updates, please contact [facility] Please consult [email or web address] for contact info under    Signed, Lennon AlstromJacquelyn D Jameir Ake, PA-C  05/16/2020 7:59 AM

## 2020-06-07 NOTE — Addendum Note (Signed)
Encounter addended by: Oneida Arenas on: 06/07/2020 10:25 AM  Actions taken: Imaging Exam ended

## 2020-06-17 ENCOUNTER — Other Ambulatory Visit: Payer: Self-pay

## 2020-06-17 ENCOUNTER — Encounter: Payer: Self-pay | Admitting: Cardiovascular Disease

## 2020-06-17 ENCOUNTER — Ambulatory Visit: Payer: BC Managed Care – PPO | Admitting: Cardiovascular Disease

## 2020-06-17 VITALS — BP 118/70 | HR 77 | Ht 74.0 in | Wt 174.0 lb

## 2020-06-17 DIAGNOSIS — R55 Syncope and collapse: Secondary | ICD-10-CM | POA: Diagnosis not present

## 2020-06-17 NOTE — Patient Instructions (Signed)
Medication Instructions:  Your physician recommends that you continue on your current medications as directed. Please refer to the Current Medication list given to you today.  *If you need a refill on your cardiac medications before your next appointment, please call your pharmacy*   Lab Work: None ordered If you have labs (blood work) drawn today and your tests are completely normal, you will receive your results only by: . MyChart Message (if you have MyChart) OR . A paper copy in the mail If you have any lab test that is abnormal or we need to change your treatment, we will call you to review the results.   Testing/Procedures: None ordered   Follow-Up: At [facility], you and your health needs are our priority.  As part of our continuing mission to provide you with exceptional heart care, we have created designated Provider Care Teams.  These Care Teams include your primary Cardiologist (physician) and Advanced Practice Providers (APPs -  Physician Assistants and Nurse Practitioners) who all work together to provide you with the care you need, when you need it.  We recommend signing up for the patient portal called "MyChart".  Sign up information is provided on this After Visit Summary.  MyChart is used to connect with patients for Virtual Visits (Telemedicine).  Patients are able to view lab/test results, encounter notes, upcoming appointments, etc.  Non-urgent messages can be sent to your provider as well.   To learn more about what you can do with MyChart, go to [email or web address].    Your next appointment:   As needed   The format for your next appointment:   In Person  Provider:   You may see Muhammad Arida, MD or one of the following Advanced Practice Providers on your designated Care Team:    Christopher Berge, NP  Ryan Dunn, PA-C  Jacquelyn Visser, PA-C  Cadence Furth, PA-C  Caitlin Walker, NP    Other Instructions N/A  

## 2020-06-17 NOTE — Progress Notes (Signed)
Cardiology Office Note   Date:  06/17/2020   ID:  Steven Gill, DOB 1995-10-11, MRN [id number]  PCP:  Ronnette Juniper, MD  Cardiologist:   Lorine Bears, MD   Chief Complaint  Patient presents with  . Other    Hospital follow up. Patient c.o irregular heart beat at times. Meds reviewed verbally with patient.       History of Present Illness: Steven Gill is a 25 y.o. male who presents for a follow-up visit after recent hospitalization for syncope.  He has prolonged history of vasovagal syncope especially in the setting of blood draws.  He had a blood draw done in December.  He was monitored for 15 minutes and did fine.  However, after about 20 minutes as he was walking to check out, he had a syncopal episode leading to head trauma and subsequent small subdural hematoma.  He was noted on telemetry to have intermittent sinus bradycardia.  He did report chest pain in the setting of blood draw due to severe anxiety. His EKG showed sinus bradycardia with a heart rate of 42 bpm.  He had an echocardiogram done while hospitalized which showed normal LV systolic function with no significant valvular abnormalities. Sinus bradycardia in the hospital was in the setting of benzodiazepine use for anxiety. He had a 2-week ZIO monitor done which showed sinus rhythm with an average heart rate of 80 bpm.  Lowest heart rate was 43 bpm with rare PACs and PVCs.  He has been doing well overall and is feeling much better.  He is off all benzodiazepines.  He is on Lexapro.  No dizziness, syncope or presyncope.  History reviewed. No pertinent past medical history.  History reviewed. No pertinent surgical history.   No current outpatient medications on file.   No current facility-administered medications for this visit.    Allergies:   Patient has no known allergies.    Social History:  The patient  reports that he has never smoked. He has never used smokeless tobacco. He reports that he does not  drink alcohol and does not use drugs.   Family History:  The patient's family history is not on file.    ROS:  Please see the history of present illness.   Otherwise, review of systems are positive for none.   All other systems are reviewed and negative.    PHYSICAL EXAM: VS:  BP 118/70 (BP Location: Left Arm, Patient Position: Sitting, Cuff Size: Normal)   Pulse 77   Ht 6\' 2"  (1.88 m)   Wt 174 lb (78.9 kg)   BMI 22.34 kg/m  , BMI Body mass index is 22.34 kg/m. GEN: Well nourished, well developed, in no acute distress  HEENT: normal  Neck: no JVD, carotid bruits, or masses Cardiac: RRR; no murmurs, rubs, or gallops,no edema  Respiratory:  clear to auscultation bilaterally, normal work of breathing GI: soft, nontender, nondistended, + BS MS: no deformity or atrophy  Skin: warm and dry, no rash Neuro:  Strength and sensation are intact Psych: euthymic mood, full affect   EKG:  EKG is ordered today. The ekg ordered today demonstrates normal sinus rhythm with no significant ST or T wave changes.  Heart rate is 77 bpm.   Recent Labs: 05/13/2020: ALT 29; ALT 28; Magnesium 2.8 05/14/2020: Hemoglobin 11.8; Platelets 121 05/16/2020: BUN 12; Creatinine, Ser 1.47; Potassium 3.9; Sodium 140; TSH 2.644    Lipid Panel No results found for: CHOL, TRIG, HDL, CHOLHDL, VLDL, LDLCALC,  LDLDIRECT    Wt Readings from Last 3 Encounters:  06/17/20 174 lb (78.9 kg)  05/12/20 185 lb (83.9 kg)  07/15/15 175 lb (79.4 kg) (86 %, Z= 1.06)*   * Growth percentiles are based on CDC (Boys, 2-20 Years) data.       No flowsheet data found.    ASSESSMENT AND PLAN:  1.  Vasovagal syncope: Mostly triggered by blood draws.  We discussed patterns of avoiding unnecessary blood draws and taking precautions during blood draws and likely monitoring for 30 minutes.  2.  Sinus bradycardia: Outpatient monitor showed appropriate bradycardia during nighttime with lowest heart rate 43 bpm.  His average heart  rate was normal and I do not think this is contributing to any of his symptoms.    Disposition:   FU with me as needed  Signed,  Lorine Bears, MD  06/17/2020 9:12 AM    [facility]

## 2020-06-29 ENCOUNTER — Other Ambulatory Visit: Payer: Self-pay | Admitting: Neurology

## 2020-06-29 DIAGNOSIS — Z87820 Personal history of traumatic brain injury: Secondary | ICD-10-CM

## 2020-06-29 DIAGNOSIS — Z8679 Personal history of other diseases of the circulatory system: Secondary | ICD-10-CM

## 2020-07-05 ENCOUNTER — Telehealth: Payer: Self-pay | Admitting: *Deleted

## 2020-07-05 NOTE — Telephone Encounter (Signed)
Attempted to call pt to review results.  No answer. Unable to leave vm.

## 2020-07-05 NOTE — Telephone Encounter (Signed)
-----   Message from Lennon Alstrom, PA-C sent at 07/02/2020  2:33 PM EST ----- Cardiac monitor showed --Predominantly a normal heart rhythm (normal sinus rhythm). --Minimum HR 43 bpm, maximum HR 163 bpm, average HR 80 bpm. --Rare extra beats from the top part of the heart (preatrial contractions). --Rare extra beats from the bottom part of the heart (preventricular contractions).  Reassuring monitor results.   No evidence of arrhythmia -- monitoring was obtained after his recent syncopal event / 05/2020 admission and to rule out arrhythmia.

## 2020-07-12 ENCOUNTER — Encounter: Payer: Self-pay | Admitting: *Deleted

## 2020-07-12 NOTE — Telephone Encounter (Signed)
Unable to reach patient and letter mailed with results and recommendations as well as request to call if any further questions.

## 2020-07-18 ENCOUNTER — Ambulatory Visit
Admission: RE | Admit: 2020-07-18 | Discharge: 2020-07-18 | Disposition: A | Discharge status: Home / Self Care | Payer: BC Managed Care – PPO | Source: Ambulatory Visit | Attending: Neurology | Admitting: Neurology

## 2020-07-18 ENCOUNTER — Other Ambulatory Visit: Payer: Self-pay

## 2020-07-18 DIAGNOSIS — Z8679 Personal history of other diseases of the circulatory system: Secondary | ICD-10-CM | POA: Diagnosis present

## 2020-07-18 DIAGNOSIS — Z87820 Personal history of traumatic brain injury: Secondary | ICD-10-CM | POA: Diagnosis not present

## 2020-07-18 IMAGING — CT CT HEAD W/O CM
4 series · 14 of 47 positions shown, 16 images · non-contrast
Comparison: CT head [DATE]

CLINICAL DATA: Head injury.  Follow-up intracranial hemorrhage

EXAM:
CT HEAD WITHOUT CONTRAST
TECHNIQUE: Contiguous axial images were obtained from the base of the skull
through the vertex without intravenous contrast.

[Series 2: axial st head 5.00 ax · axial · 0.35mm/px · z∈[-536,-437]mm · 6 of 29 slices shown, 8 images]
[im 5/29  brain]
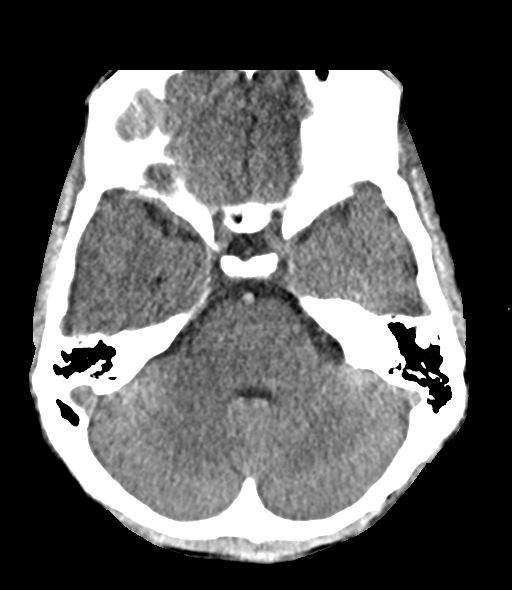
[im 5/29  bone]
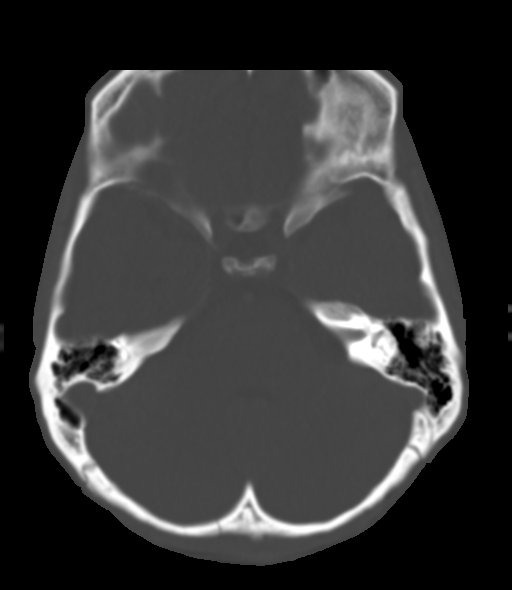
[im 9/29  brain]
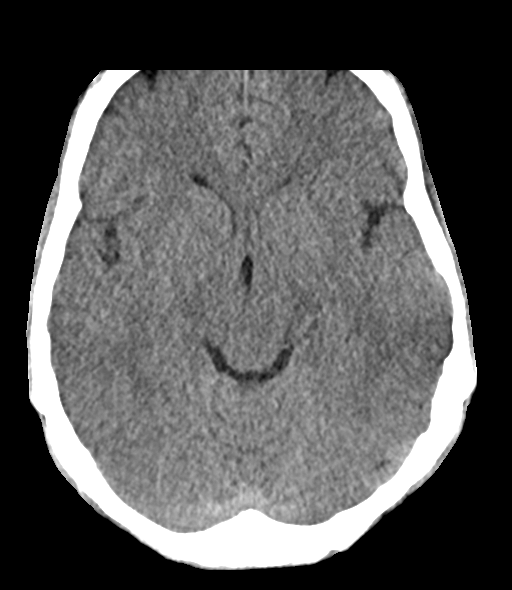
[im 13/29  brain]
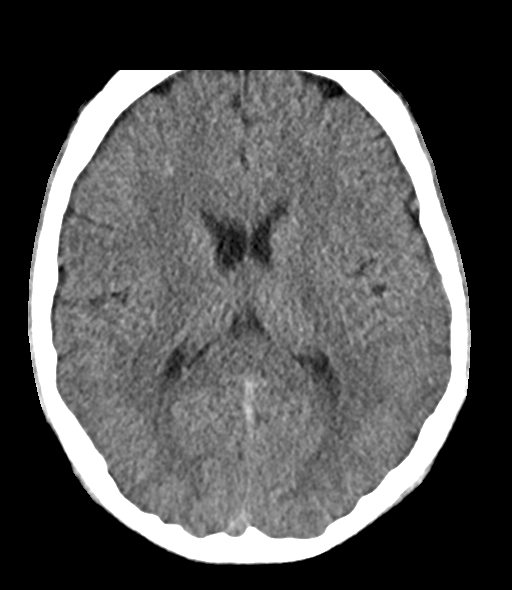
[im 17/29  brain]
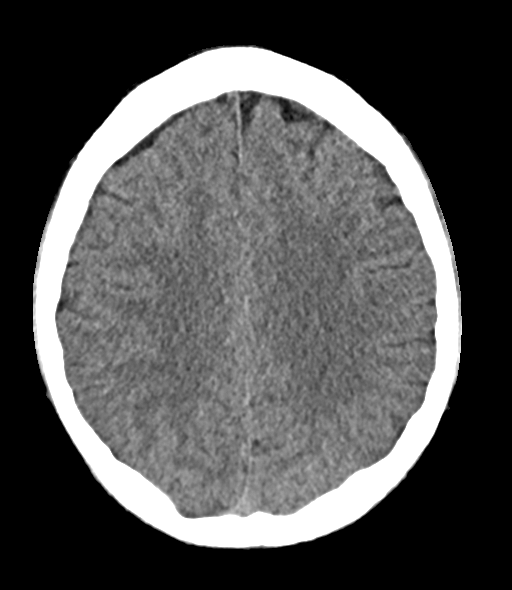
[im 21/29  brain]
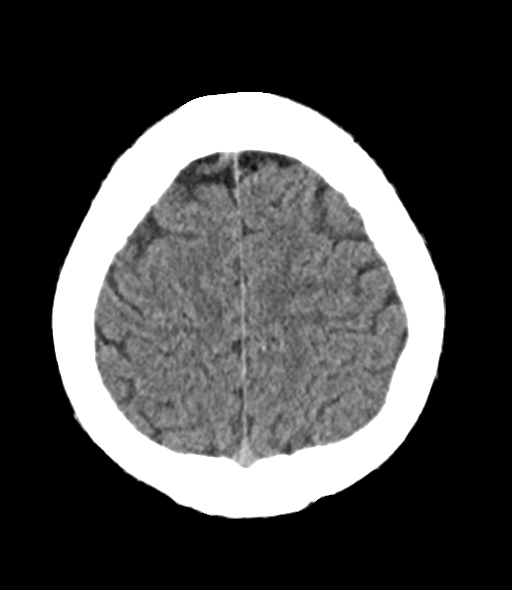
[im 21/29  bone]
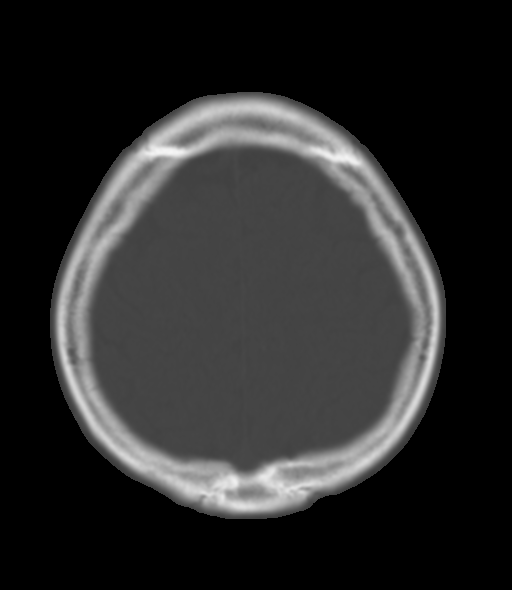
[im 25/29  brain]
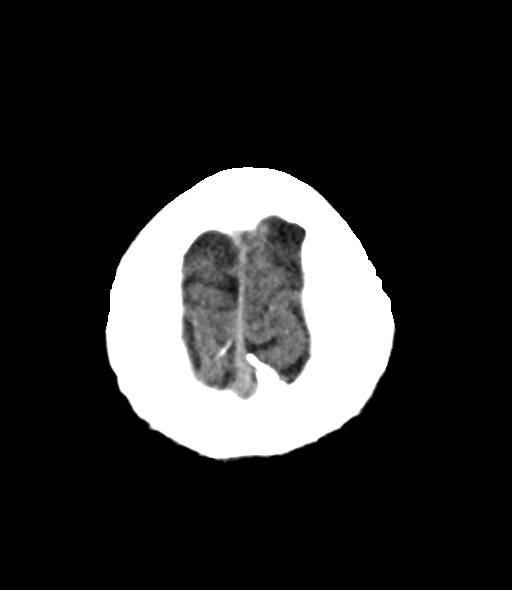

[Series 4: bone windows head 2.00 ax · axial · 0.35mm/px · z∈[-545,-532]mm · 2 of 74 slices shown]
[im 7/74  bone]
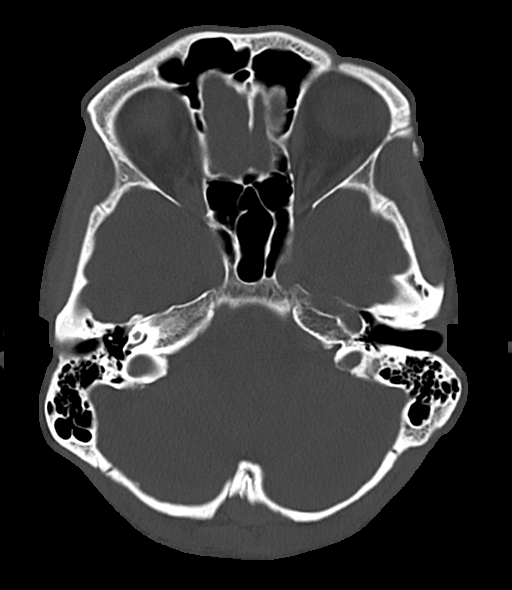
[im 14/74  bone]
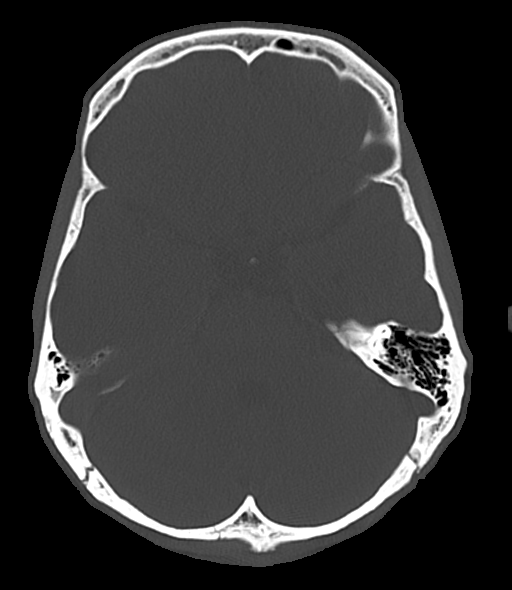

[Series 6: coronals head 3.00 cor · coronal · 0.29mm/px · 3 of 68 slices shown]
[im 23/68  brain]
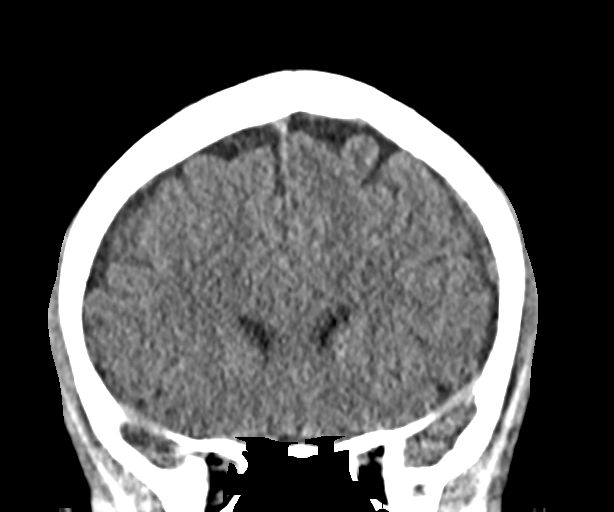
[im 30/68  brain]
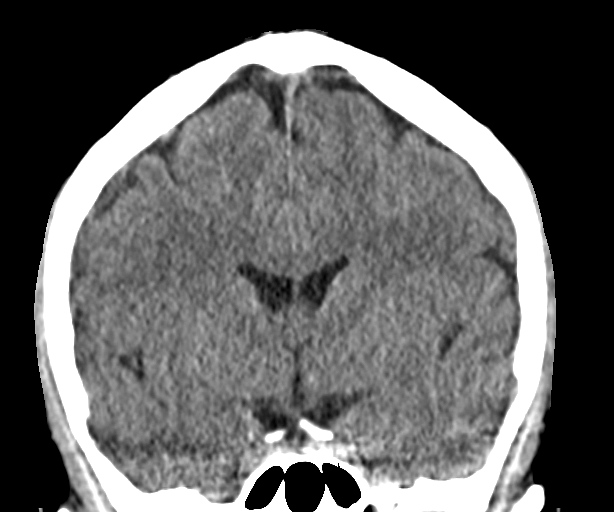
[im 38/68  brain]
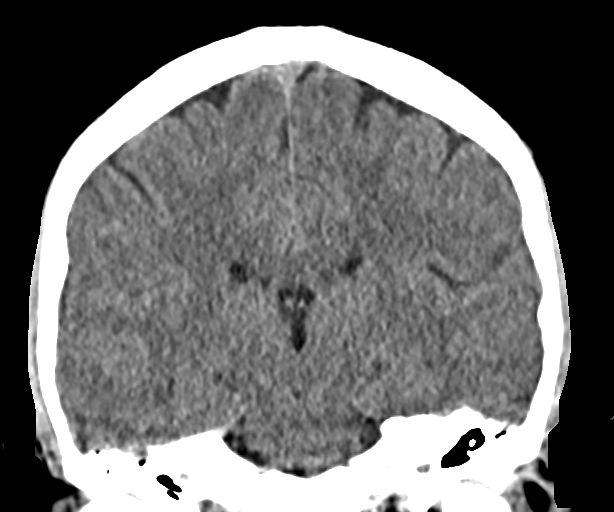

[Series 8: sagittals head 3.00 sag · sagittal · 0.29mm/px · 3 of 59 slices shown]
[im 20/59  brain]
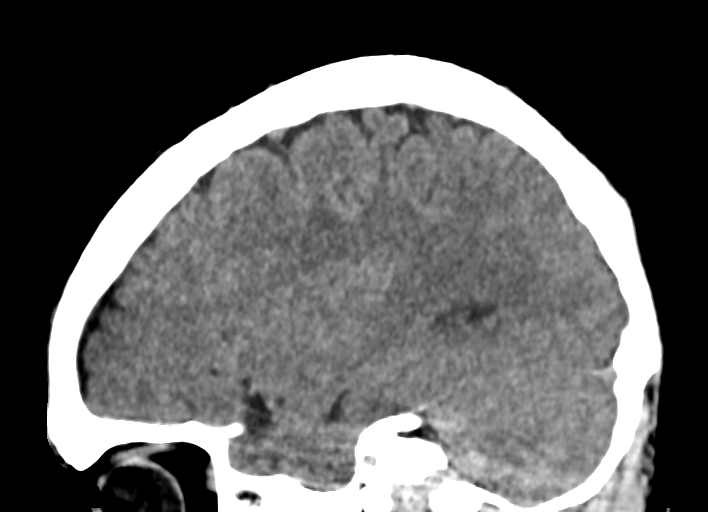
[im 30/59  brain]
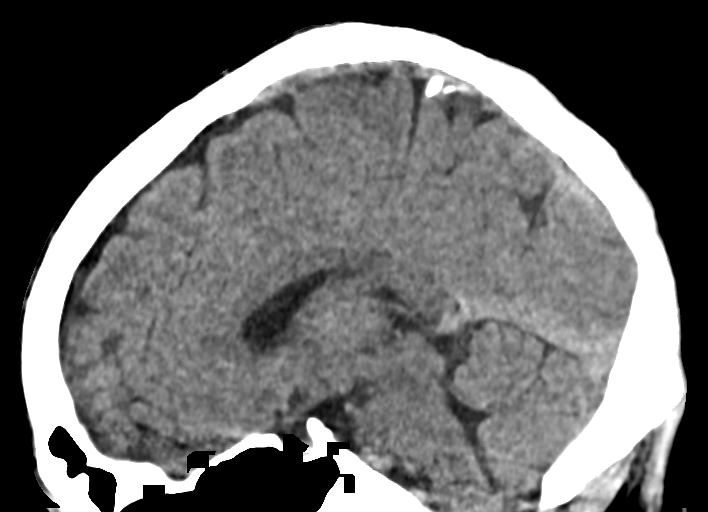
[im 39/59  brain]
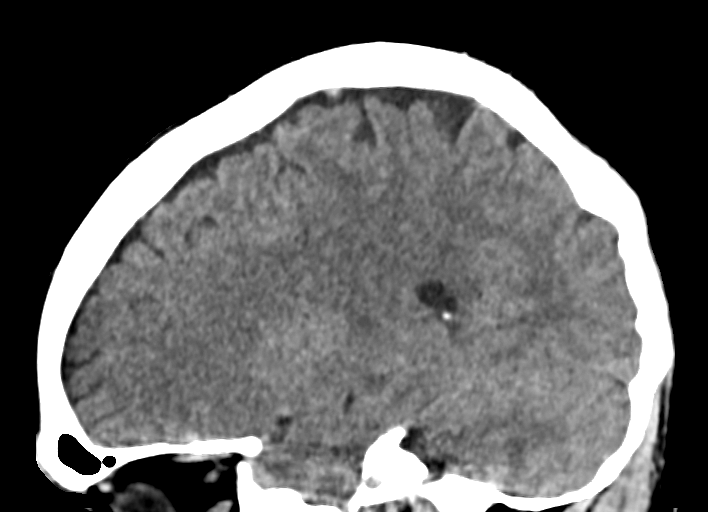

[14 of 47 positions shown; findings below may reference images not displayed]

FINDINGS: Brain: Interhemispheric subdural hematoma has resolved.

1 cm hypodensity left inferolateral frontal lobe compatible with
cerebral contusion. Previously noted hemorrhage in this area has
resolved. No residual high-density hemorrhage.

Ventricle size normal. No midline shift. Negative for infarct or
mass.

Vascular: Negative for hyperdense vessel

Skull: Right medial occipital skull fracture extending to the
foramen magnum unchanged.

Sinuses/Orbits: Paranasal sinuses clear.  Negative orbit

Other: None
IMPRESSION: Interval resolution of subdural hematoma.

Encephalomalacia left lateral frontal lobe consistent with cerebral
contusion. No residual high density blood products present.

## 2020-09-01 ENCOUNTER — Ambulatory Visit (INDEPENDENT_AMBULATORY_CARE_PROVIDER_SITE_OTHER): Payer: BC Managed Care – PPO | Admitting: Mental Health

## 2020-09-01 ENCOUNTER — Other Ambulatory Visit: Payer: Self-pay

## 2020-09-01 DIAGNOSIS — F4323 Adjustment disorder with mixed anxiety and depressed mood: Secondary | ICD-10-CM | POA: Diagnosis not present
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
## 2020-09-19 ENCOUNTER — Ambulatory Visit (INDEPENDENT_AMBULATORY_CARE_PROVIDER_SITE_OTHER): Payer: BC Managed Care – PPO | Admitting: Mental Health

## 2020-09-19 ENCOUNTER — Other Ambulatory Visit: Payer: Self-pay

## 2020-09-19 DIAGNOSIS — F4323 Adjustment disorder with mixed anxiety and depressed mood: Secondary | ICD-10-CM | POA: Diagnosis not present
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
## 2020-10-19 ENCOUNTER — Other Ambulatory Visit: Payer: Self-pay

## 2020-10-19 ENCOUNTER — Ambulatory Visit (INDEPENDENT_AMBULATORY_CARE_PROVIDER_SITE_OTHER): Payer: BC Managed Care – PPO | Admitting: Mental Health

## 2020-10-19 DIAGNOSIS — F4323 Adjustment disorder with mixed anxiety and depressed mood: Secondary | ICD-10-CM

## 2020-10-19 NOTE — Progress Notes (Signed)
[facility] Psychotherapy Note  Name: Steven Gill Date: 10/19/2020 MRN: [id number] DOB: 03-Jun-1995 PCP: Kandyce Rud, MD  Time spent: 51 minutes  Treatment:   Individual therapy  Mental Status Exam:    Appearance:    Casual     Behavior:   Appropriate  Motor:   WNL  Speech/Language:    Clear and Coherent  Affect:   Full range   Mood:   Euthymic  Thought process:   normal  Thought content:     WNL  Sensory/Perceptual disturbances:     none  Orientation:   x4  Attention:   Good  Concentration:   Good  Memory:   Intact  Fund of knowledge:    Consistent with age and development  Insight:     Good  Judgment:    Good  Impulse Control:   Good    Reported Symptoms: Irritability, anger outbursts with some physical aggression at times, sadness, anxiety   Subjective: Patient presents for session on time.  He shared changes, how he now has a girlfriend, sharing some experiences they have had over the past month, which is how long they have been dating.  He stated his plan for the fall was to attend college on line.  He continues to not drive due to medical orders but states he will be cleared at the end of this month, which he looks forward to.  He identified the need to have more independence and driving specifically will provide this in part.  He processed thoughts and feelings related to the significant life changes he has had over the last few months, living at home again, being unable to drive etc.  He continues to make time with friends and is able to identify thoughts about his future with which he is trying to feel more optimistic.  Facilitated his identifying ongoing needs, ways to cope and care for himself.  Encouraged him to make time to be intentional about reminding himself of the positive changes that he has made for himself during this time of adjustment between sessions.   Diagnoses:    ICD-10-CM   1. Adjustment disorder with mixed anxiety and depressed mood   F43.23     Plan: Patient is to use CBT, coping skills to help manage decrease symptoms.  Patient to make an effort to spend more time with friends, engage in pleasurable interests as outlets for stress reduction.   Long-term goal:  Reduce overall level, frequency, and intensity of the feelings of emotional distress that would lead to decrease feelings of sadness, frustration and anger outbursts.  Short-term goal: To identify and process feelings related to the disappointment of past painful events that increase worthless feelings.                   Verbally express understanding of the relationship between depressed mood and repression of feelings - such as anger, hurt, and sadness.                              Improve coping skills toward decreasing any anger outbursts, irritability  Assessment of progress:  progressing    Waldron Session, [facility]

## 2020-12-06 ENCOUNTER — Ambulatory Visit (INDEPENDENT_AMBULATORY_CARE_PROVIDER_SITE_OTHER): Payer: BC Managed Care – PPO | Admitting: Mental Health

## 2020-12-06 ENCOUNTER — Telehealth: Payer: Self-pay | Admitting: Cardiovascular Disease

## 2020-12-06 ENCOUNTER — Other Ambulatory Visit: Payer: Self-pay

## 2020-12-06 DIAGNOSIS — F4323 Adjustment disorder with mixed anxiety and depressed mood: Secondary | ICD-10-CM

## 2020-12-06 NOTE — Telephone Encounter (Signed)
Patient father calling States that there was a charge from monitor that insurance says he owes but our billing department has no record of this charge and he never received a bill  Would like to know if this was pre authorized with ZIO and insurance  Please call to discuss

## 2020-12-06 NOTE — Telephone Encounter (Signed)
Patient had a ZIO AT placed in January 2022 prior to hospital discharge for syncope.  This was placed by hospital staff.    To [place]- Office Manager to please review and advise.
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
## 2020-12-20 ENCOUNTER — Other Ambulatory Visit: Payer: Self-pay

## 2020-12-20 ENCOUNTER — Ambulatory Visit (INDEPENDENT_AMBULATORY_CARE_PROVIDER_SITE_OTHER): Payer: BC Managed Care – PPO | Admitting: Mental Health

## 2020-12-20 DIAGNOSIS — F4323 Adjustment disorder with mixed anxiety and depressed mood: Secondary | ICD-10-CM | POA: Diagnosis not present

## 2020-12-20 NOTE — Progress Notes (Signed)
[facility] Psychotherapy Note  Name: Steven Gill Date: 12/20/2020 MRN: [id number] DOB: [birth year] PCP: Kandyce Rud, MD  Time spent: 50 minutes  Treatment:   Individual therapy  Mental Status Exam:    Appearance:    Casual     Behavior:   Appropriate  Motor:   WNL  Speech/Language:    Clear and Coherent  Affect:   Full range   Mood:   Euthymic  Thought process:   normal  Thought content:     WNL  Sensory/Perceptual disturbances:     none  Orientation:   x4  Attention:   Good  Concentration:   Good  Memory:   Intact  Fund of knowledge:    Consistent with age and development  Insight:     Good  Judgment:    Good  Impulse Control:   Good    Reported Symptoms: Irritability, anger outbursts with some physical aggression at times, sadness, anxiety   Subjective: Patient presents for session on time.  He shared progress, how he is determined that he will stay living at home and has reenrolled in college.  He shared the challenges of getting enrolled, as he had to appeal getting back into school due to some low grades he had a few semesters ago.  He stated that he had appealed about a month ago, but learned yesterday that his classes were not set up yet.  This caused him to feel panic but was able to email the director and has been approved for enrollment.  He identified ultimately wanting to live independently and go to school, however, recognizes the advantages of enrolling with a full course load as he will finish his degree sooner.  He went on to share more details related to the relationship he has with his father, how they were able to talk recently where he stated his dad was more supportive.  He shared more history related to how they have had a strained relationship at times as well as feelings related.  He also had to cope recently with the break-up from a girlfriend, sharing some details but that how he feels he has been able to move forward since.  He identified the  need to set a better sleep schedule for himself to be more successful in school and we collaboratively explored ways to do so in session.   Diagnoses:    ICD-10-CM   1. Adjustment disorder with mixed anxiety and depressed mood  F43.23         Plan: Patient to make an effort to spend more time with friends, engage in pleasurable interests as outlets for stress reduction.  Patient to identify a schedule to ready himself for school which is in about 1 week, with a focus on getting enough rest    .  long-term goal:  Reduce overall level, frequency, and intensity of the feelings of emotional distress that would lead to decrease feelings of sadness, frustration and anger outbursts.  Short-term goal: To identify and process feelings related to the disappointment of past painful events that increase worthless feelings.                   Verbally express understanding of the relationship between depressed mood and repression of feelings - such as anger, hurt, and sadness.                              Improve coping skills  toward decreasing any anger outbursts, irritability  Assessment of progress:  progressing    Waldron Session, [employer]

## 2021-01-04 ENCOUNTER — Ambulatory Visit: Payer: BC Managed Care – PPO | Admitting: Mental Health

## 2021-01-23 ENCOUNTER — Ambulatory Visit: Payer: BC Managed Care – PPO | Admitting: Mental Health

## 2021-02-06 ENCOUNTER — Ambulatory Visit: Payer: BC Managed Care – PPO | Admitting: Mental Health

## 2021-11-19 ENCOUNTER — Ambulatory Visit
Admission: EM | Admit: 2021-11-19 | Discharge: 2021-11-19 | Disposition: A | Discharge status: Home / Self Care | Payer: BC Managed Care – PPO

## 2021-11-19 ENCOUNTER — Encounter: Payer: Self-pay | Admitting: Emergency Medicine

## 2021-11-19 DIAGNOSIS — J039 Acute tonsillitis, unspecified: Secondary | ICD-10-CM | POA: Diagnosis not present

## 2021-11-19 DIAGNOSIS — R112 Nausea with vomiting, unspecified: Secondary | ICD-10-CM

## 2021-11-19 DIAGNOSIS — R110 Nausea: Secondary | ICD-10-CM | POA: Diagnosis not present

## 2021-11-19 HISTORY — DX: Depression, unspecified: F32.A

## 2021-11-19 HISTORY — DX: Anxiety disorder, unspecified: F41.9

## 2021-11-19 MED ORDER — ONDANSETRON HCL 4 MG PO TABS: 4.0000 mg | ORAL_TABLET | Freq: Four times a day (QID) | ORAL | 0 refills | Status: DC

## 2021-11-19 MED ORDER — AMOXICILLIN 875 MG PO TABS: 875.0000 mg | ORAL_TABLET | Freq: Two times a day (BID) | ORAL | 0 refills | Status: AC

## 2021-11-19 NOTE — ED Provider Notes (Signed)
[name]    CSN: [id number] Arrival date & time: 11/19/21  1226      History   Chief Complaint Chief Complaint  Patient presents with   Fever   Lymphadenopathy    HPI Steven Gill is a 26 y.o. male.   HPI Patient presents for evaluation of swollen and tender lymph nodes x6 days.  Patient seen by his primary care provider on for evaluation of the same symptoms and at that time he did complain of soreness of throat.  Patient had a negative work-up for strep, COVID, and influenza.  He reports continually having fevers and soreness of throat.  He is taking a total of 3 doses of amoxicillin 500 mg twice daily without any improvement.  He is currently afebrile and had his last dose of ibuprofen around 5 AM.  He has also had some vomiting and poor appetite.  He is concerned that he may have mono.  He is not having any rashes and he is unaware of any known sick contacts.  He has no history of recurrent strep.  Past Medical History:  Diagnosis Date   Anxiety    Depression     Patient Active Problem List   Diagnosis Date Noted   Bradycardia    ICH (intracerebral hemorrhage) (HCC) 05/14/2020   Fall 05/14/2020   Subdural hematoma (HCC) 05/13/2020   Hypokalemia 05/13/2020   Syncope, vasovagal 05/13/2020   Proteinuria 05/13/2020   Seizure-like activity (HCC) 05/13/2020   AKI (acute kidney injury) (HCC) 05/13/2020    Past Surgical History:  Procedure Laterality Date   ANTERIOR CRUCIATE LIGAMENT REPAIR Left        Home Medications    Prior to Admission medications   Medication Sig Start Date End Date Taking? Authorizing Provider  amoxicillin (AMOXIL) 875 MG tablet Take 1 tablet (875 mg total) by mouth 2 (two) times daily for 7 days. 11/19/21 11/26/21 Yes Bing Neighbors, FNP  ondansetron (ZOFRAN) 4 MG tablet Take 1 tablet (4 mg total) by mouth every 6 (six) hours. 11/19/21  Yes Bing Neighbors, FNP  buPROPion (WELLBUTRIN XL) 300 MG 24 hr tablet Take 300 mg by  mouth daily. 11/06/21   [provider]  escitalopram (LEXAPRO) 10 MG tablet Take 10 mg by mouth daily. 11/19/21   [provider]  Multiple Vitamin (MULTI-VITAMIN) tablet Take 1 tablet by mouth daily.    [provider]    Family History Family History  Problem Relation Age of Onset   Other Neg Hx     Social History Social History   Tobacco Use   Smoking status: Never   Smokeless tobacco: Never  Vaping Use   Vaping Use: Never used  Substance Use Topics   Alcohol use: No   Drug use: No     Allergies   Patient has no known allergies.   Review of Systems Review of Systems Pertinent negatives listed in HPI   Physical Exam Triage Vital Signs ED Triage Vitals  Enc Vitals Group     BP 11/19/21 1304 120/70     Pulse Rate 11/19/21 1304 (!) 101     Resp 11/19/21 1304 18     Temp 11/19/21 1304 99.2 F (37.3 C)     Temp Source 11/19/21 1304 Oral     SpO2 11/19/21 1304 98 %     Weight --      Height --      Head Circumference --      Peak  Flow --      Pain Score 11/19/21 1303 4     Pain Loc --      Pain Edu? --      Excl. in GC? --    No data found.  Updated Vital Signs BP 120/70 (BP Location: Left Arm)   Pulse (!) 101   Temp 99.2 F (37.3 C) (Oral)   Resp 18   SpO2 98%   Visual Acuity Right Eye Distance:   Left Eye Distance:   Bilateral Distance:    Right Eye Near:   Left Eye Near:    Bilateral Near:     Physical Exam Constitutional:      Appearance: Normal appearance.  HENT:     Head: Normocephalic and atraumatic.     Right Ear: Tympanic membrane and ear canal normal.     Left Ear: Ear canal normal.     Nose: Congestion present.     Mouth/Throat:     Pharynx: Posterior oropharyngeal erythema present.     Tonsils: No tonsillar exudate. 3+ on the right. 3+ on the left.  Eyes:     Extraocular Movements: Extraocular movements intact.     Pupils: Pupils are equal, round, and reactive to light.  Cardiovascular:     Rate  and Rhythm: Regular rhythm. Tachycardia present.  Pulmonary:     Effort: Pulmonary effort is normal.     Breath sounds: Normal breath sounds.  Musculoskeletal:     Cervical back: Normal range of motion.  Lymphadenopathy:     Cervical: Cervical adenopathy present.  Skin:    General: Skin is warm.     Capillary Refill: Capillary refill takes less than 2 seconds.     Findings: No rash.  Neurological:     General: No focal deficit present.     Mental Status: He is alert.     GCS: GCS eye subscore is 4. GCS verbal subscore is 5. GCS motor subscore is 6.      UC Treatments / Results  Labs (all labs ordered are listed, but only abnormal results are displayed) Labs Reviewed - No data to display  EKG   Radiology No results found.  Procedures Procedures (including critical care time)  Medications Ordered in UC Medications - No data to display  Initial Impression / Assessment and Plan / UC Course  I have reviewed the triage vital signs and the nursing notes.  Pertinent labs & imaging results that were available during my care of the patient were reviewed by me and considered in my medical decision making (see chart for details).    Acute Tonsillitis with nausea  with vomiting Increased dose of amoxicillin to 875 BID for more appropriate treatment of acute tonsillitis.  Advised to discontinue the amoxicillin 500 twice daily. Advised to treat only temps greater than 100 with Tylenol and only use ibuprofen for fever is recurrent. Recommend increasing intake of bland solid foods to regain strength. Also educated that if patient has morning typically with amoxicillin patient can only develop a rash she has no rash and lymphadenopathy is as expected with an acute tonsillitis..  Encouraged hydration and rest.  Advised if any of his symptoms worsen or do not readily improve to go immediately to the ER or follow-up with his primary care doctor and/or return here. Final Clinical  Impressions(s) / UC Diagnoses   Final diagnoses:  Acute tonsillitis, unspecified etiology  Nausea and vomiting, unspecified vomiting type     Discharge Instructions  Continue Tylenol for fever.  Limit ibuprofen only if you have a high fever that does not resolve with Tylenol.  Discontinue the amoxicillin 500 and start amoxicillin 875 as I do believe you have an infection involving her tonsils.  Hydrate well with fluids.  Recommend a bland diet   ED Prescriptions     Medication Sig Dispense Auth. Provider   amoxicillin (AMOXIL) 875 MG tablet Take 1 tablet (875 mg total) by mouth 2 (two) times daily for 7 days. 14 tablet Bing Neighbors, FNP   ondansetron (ZOFRAN) 4 MG tablet Take 1 tablet (4 mg total) by mouth every 6 (six) hours. 12 tablet Bing Neighbors, FNP      PDMP not reviewed this encounter.   Bing Neighbors, FNP 11/22/21 1216

## 2021-11-19 NOTE — ED Triage Notes (Addendum)
Pt presents with fever and swollen lymph nodes x 6 days. He was seen by his PCP on 7/7 strep was negative and prescribed abx. He started vomiting yesterday morning. His fever goes down with fever reducer.

## 2021-11-19 NOTE — Discharge Instructions (Signed)
Continue Tylenol for fever.  Limit ibuprofen only if you have a high fever that does not resolve with Tylenol.  Discontinue the amoxicillin 500 and start amoxicillin 875 as I do believe you have an infection involving her tonsils.  Hydrate well with fluids.  Recommend a bland diet

## 2021-11-26 ENCOUNTER — Ambulatory Visit
Admission: EM | Admit: 2021-11-26 | Discharge: 2021-11-26 | Disposition: A | Discharge status: Home / Self Care | Payer: BC Managed Care – PPO | Attending: Emergency Medicine | Admitting: Emergency Medicine

## 2021-11-26 DIAGNOSIS — R591 Generalized enlarged lymph nodes: Secondary | ICD-10-CM

## 2021-11-26 DIAGNOSIS — R21 Rash and other nonspecific skin eruption: Secondary | ICD-10-CM

## 2021-11-26 LAB — POCT MONO SCREEN (KUC): Mono, POC: NEGATIVE

## 2021-11-26 NOTE — ED Triage Notes (Signed)
Patient presents to Urgent Care with complaints of generalized body rash since today. Swollen lymph nodes since 07/03. States it comes and goes. Completed the antibiotics prescribed last visit.

## 2021-11-26 NOTE — Discharge Instructions (Addendum)
Follow-up with your primary care provider tomorrow.

## 2021-11-26 NOTE — ED Provider Notes (Signed)
[facility]    CSN: [id number] Arrival date & time: 11/26/21  0831      History   Chief Complaint Chief Complaint  Patient presents with   Rash    HPI Steven Gill is a 26 y.o. male.  Accompanied by his father, patient presents with a rash on trunk and extremities today.  He also reports 2-week history of swollen lymph nodes in his neck.  He has loose stool 1x/day for the past 2 weeks; none today.  He denies sore throat, fever, cough, shortness of breath, abdominal pain, nausea, vomiting, or other symptoms.  Patient was seen at this urgent care on 11/19/2021; diagnosed with acute tonsillitis, nausea and vomiting; treated with increased dose of amoxicillin to 875 mg BID and Zofran.  He was seen by his PCP on 11/16/2021 for sore throat and fever; negative for flu, COVID, strep; symptomatic treatment.  His PCP started him on amoxicillin 500 mg BID on 11/17/2021.   His medical history includes intracerebral hemorrhage, subdural hematoma, syncope, acute kidney injury, seizure-like activity, anxiety, depression.  The history is provided by the patient and medical records.    Past Medical History:  Diagnosis Date   Anxiety    Depression     Patient Active Problem List   Diagnosis Date Noted   Bradycardia    ICH (intracerebral hemorrhage) (HCC) 05/14/2020   Fall 05/14/2020   Subdural hematoma (HCC) 05/13/2020   Hypokalemia 05/13/2020   Syncope, vasovagal 05/13/2020   Proteinuria 05/13/2020   Seizure-like activity (HCC) 05/13/2020   AKI (acute kidney injury) (HCC) 05/13/2020    Past Surgical History:  Procedure Laterality Date   ANTERIOR CRUCIATE LIGAMENT REPAIR Left        Home Medications    Prior to Admission medications   Medication Sig Start Date End Date Taking? Authorizing Provider  amoxicillin (AMOXIL) 875 MG tablet Take 1 tablet (875 mg total) by mouth 2 (two) times daily for 7 days. 11/19/21 11/26/21  Bing Neighbors, FNP  buPROPion (WELLBUTRIN XL) 300 MG  24 hr tablet Take 300 mg by mouth daily. 11/06/21   [provider]  escitalopram (LEXAPRO) 10 MG tablet Take 10 mg by mouth daily. 11/19/21   [provider]  Multiple Vitamin (MULTI-VITAMIN) tablet Take 1 tablet by mouth daily.    [provider]  ondansetron (ZOFRAN) 4 MG tablet Take 1 tablet (4 mg total) by mouth every 6 (six) hours. 11/19/21   Bing Neighbors, FNP    Family History Family History  Problem Relation Age of Onset   Other Neg Hx     Social History Social History   Tobacco Use   Smoking status: Never   Smokeless tobacco: Never  Vaping Use   Vaping Use: Never used  Substance Use Topics   Alcohol use: No   Drug use: No     Allergies   Patient has no known allergies.   Review of Systems Review of Systems  Constitutional:  Negative for chills and fever.  HENT:  Positive for sore throat. Negative for ear pain, trouble swallowing and voice change.   Respiratory:  Negative for cough and shortness of breath.   Gastrointestinal:  Negative for abdominal pain, diarrhea, nausea and vomiting.  Skin:  Positive for rash. Negative for color change.  All other systems reviewed and are negative.    Physical Exam Triage Vital Signs ED Triage Vitals [11/26/21 0840]  Enc Vitals Group     BP  Pulse      Resp      Temp      Temp src      SpO2      Weight      Height      Head Circumference      Peak Flow      Pain Score 0     Pain Loc      Pain Edu?      Excl. in GC?    No data found.  Updated Vital Signs BP (!) 103/57   Pulse 95   Temp 98.6 F (37 C)   Resp 18   SpO2 98%   Visual Acuity Right Eye Distance:   Left Eye Distance:   Bilateral Distance:    Right Eye Near:   Left Eye Near:    Bilateral Near:     Physical Exam Vitals and nursing note reviewed.  Constitutional:      General: He is not in acute distress.    Appearance: He is well-developed. He is not ill-appearing.  HENT:     Right Ear: Tympanic  membrane normal.     Left Ear: Tympanic membrane normal.     Nose: Nose normal.     Mouth/Throat:     Mouth: Mucous membranes are moist.     Pharynx: Posterior oropharyngeal erythema present.  Neck:     Comments: Bilateral submandibular lymphadenopathy. Cardiovascular:     Rate and Rhythm: Normal rate and regular rhythm.     Heart sounds: Normal heart sounds.  Pulmonary:     Effort: Pulmonary effort is normal. No respiratory distress.     Breath sounds: Normal breath sounds.  Abdominal:     Palpations: Abdomen is soft.     Tenderness: There is no abdominal tenderness.  Musculoskeletal:     Cervical back: Neck supple. No rigidity.  Lymphadenopathy:     Cervical: Cervical adenopathy present.  Skin:    General: Skin is warm and dry.     Findings: Rash present.     Comments: Light pink blanchable maculopapular rash on trunk and extremities.   Neurological:     Mental Status: He is alert.  Psychiatric:        Mood and Affect: Mood normal.        Behavior: Behavior normal.      UC Treatments / Results  Labs (all labs ordered are listed, but only abnormal results are displayed) Labs Reviewed  POCT MONO SCREEN [facility])    EKG   Radiology No results found.  Procedures Procedures (including critical care time)  Medications Ordered in UC Medications - No data to display  Initial Impression / Assessment and Plan / UC Course  I have reviewed the triage vital signs and the nursing notes.  Pertinent labs & imaging results that were available during my care of the patient were reviewed by me and considered in my medical decision making (see chart for details).  Lymphadenopathy, rash.  Mono test negative.  Afebrile and vital signs are stable.  Instructed patient to continue the amoxicillin as directed.  Instructed him to follow-up with his PCP tomorrow.  Education provided on lymphadenopathy and rash.  Patient and his father agreed to plan of care.   Final Clinical  Impressions(s) / UC Diagnoses   Final diagnoses:  Lymphadenopathy  Rash     Discharge Instructions      Follow up with your primary care provider tomorrow.        ED Prescriptions  None    PDMP not reviewed this encounter.   Mickie Bail, NP 11/26/21 [phone]

## 2022-03-01 ENCOUNTER — Inpatient Hospital Stay: Payer: BC Managed Care – PPO | Attending: Internal Medicine | Admitting: Internal Medicine

## 2022-03-01 ENCOUNTER — Encounter: Payer: Self-pay | Admitting: Internal Medicine

## 2022-03-01 ENCOUNTER — Inpatient Hospital Stay: Payer: BC Managed Care – PPO

## 2022-03-01 VITALS — BP 110/61 | HR 72 | Temp 97.8°F | Resp 14 | Ht 74.0 in | Wt 182.2 lb

## 2022-03-01 DIAGNOSIS — M2560 Stiffness of unspecified joint, not elsewhere classified: Secondary | ICD-10-CM | POA: Insufficient documentation

## 2022-03-01 DIAGNOSIS — R61 Generalized hyperhidrosis: Secondary | ICD-10-CM | POA: Insufficient documentation

## 2022-03-01 DIAGNOSIS — R634 Abnormal weight loss: Secondary | ICD-10-CM | POA: Diagnosis not present

## 2022-03-01 DIAGNOSIS — R509 Fever, unspecified: Secondary | ICD-10-CM | POA: Diagnosis not present

## 2022-03-01 DIAGNOSIS — D649 Anemia, unspecified: Secondary | ICD-10-CM

## 2022-03-01 DIAGNOSIS — R590 Localized enlarged lymph nodes: Secondary | ICD-10-CM

## 2022-03-01 LAB — CBC WITH DIFFERENTIAL/PLATELET
Abs Immature Granulocytes: 0.11 10*3/uL — ABNORMAL HIGH (ref 0.00–0.07)
Basophils Absolute: 0 10*3/uL (ref 0.0–0.1)
Basophils Relative: 0 %
Eosinophils Absolute: 0.1 10*3/uL (ref 0.0–0.5)
Eosinophils Relative: 1 %
HCT: 45.6 % (ref 39.0–52.0)
Hemoglobin: 14.4 g/dL (ref 13.0–17.0)
Immature Granulocytes: 2 %
Lymphocytes Relative: 22 %
Lymphs Abs: 1.7 10*3/uL (ref 0.7–4.0)
MCH: 26.3 pg (ref 26.0–34.0)
MCHC: 31.6 g/dL (ref 30.0–36.0)
MCV: 83.4 fL (ref 80.0–100.0)
Monocytes Absolute: 0.5 10*3/uL (ref 0.1–1.0)
Monocytes Relative: 7 %
Neutro Abs: 5.1 10*3/uL (ref 1.7–7.7)
Neutrophils Relative %: 68 %
Platelets: 307 10*3/uL (ref 150–400)
RBC: 5.47 MIL/uL (ref 4.22–5.81)
RDW: 14.3 % (ref 11.5–15.5)
WBC: 7.5 10*3/uL (ref 4.0–10.5)
nRBC: 0 % (ref 0.0–0.2)

## 2022-03-01 LAB — VITAMIN B12: Vitamin B-12: 625 pg/mL (ref 180–914)

## 2022-03-01 LAB — IRON AND TIBC
Iron: 56 ug/dL (ref 45–182)
Saturation Ratios: 14 % — ABNORMAL LOW (ref 17.9–39.5)
TIBC: 388 ug/dL (ref 250–450)
UIBC: 332 ug/dL

## 2022-03-01 LAB — TSH: TSH: 1.692 u[IU]/mL (ref 0.350–4.500)

## 2022-03-01 LAB — FOLATE: Folate: 9.5 ng/mL (ref >5.9)

## 2022-03-01 LAB — LACTATE DEHYDROGENASE: LDH: 139 U/L (ref 98–192)

## 2022-03-01 NOTE — Progress Notes (Signed)
[name]  Telephone:(336) [phone] Fax:([phone]  ID: [name] OB: 1995-05-25  MR#: [id number]  NAT#:[id number]  Patient Care Team: Derinda Late, MD as PCP - General (Family Medicine)  REFERRING PROVIDER: Dr. Derinda Late  REASON FOR REFERRAL: cervical lymphadenopathy, fever  HPI: Steven Gill is a 26 y.o. male with past medical history of depression, migraine, subdural hematoma after fall in 2021, seizures was referred to hematology for further work-up of cervical lymphadenopathy and fevers.  Patient was in his general state of health until June 2023 when he was on holiday at beach, he developed fevers and severe body aches which lasted for a week. It spontaneously resolved.  He then again developed fevers to 102, swelling in ankle and knees, enlarged neck lymph nodes in July 2023. He was treated with amoxicillin and his symptoms resolved.  Then similar symptoms recurred in late July which was treated with second course of amoxicillin.  He broke into diffuse rash which was thought to be drug-induced and it resolved in a week.  The symptoms then recurred in late August and he went to urgent care.  He was treated with 1 week of prednisone which resolved his symptoms.  In October 2023 he again developed fevers to 103 and was retreated with prednisone. He has night sweats almost every day.  Appetite is good.  Reports some weight loss.  Each time his symptoms relapsed, he also has associated joint stiffness casuing difficulty to walk.  Denies any travel out of the [place].  Denies any tick bites recently.  He has 2 dogs at home.  Denies any family history of lymphoma or autoimmune process.  Denies any OTC medication or herbal supplements.  He does nicotine vaping. Denies other drug use.    He was seen by Dr. Posey Pronto of [facility] rheumatology on 02/28/2022 and extensive work-up for autoimmune process has been ordered.  Labs reviewed.  ESR elevated to 70, CRP 21, ANA  negative, RA factor normal, Monospot negative, EBV IgG elevated 442 but IgM normal.  CMV IgG/IgM negative.  CK 100.  Lyme PCR negative.  Respiratory panel negative WBC 4.2, hemoglobin 12.3, MCV 84, platelet 246.  Mild lymphopenia 0.95.  ALP mildly elevated 113 otherwise unremarkable.   REVIEW OF SYSTEMS:   ROS  As per HPI. Otherwise, a complete review of systems is negative.  PAST MEDICAL HISTORY: Past Medical History:  Diagnosis Date   Anxiety    Depression     PAST SURGICAL HISTORY: Past Surgical History:  Procedure Laterality Date   ANTERIOR CRUCIATE LIGAMENT REPAIR Left     FAMILY HISTORY: Family History  Problem Relation Age of Onset   Other Neg Hx     HEALTH MAINTENANCE: Social History   Tobacco Use   Smoking status: Never   Smokeless tobacco: Never  Vaping Use   Vaping Use: Never used  Substance Use Topics   Alcohol use: No   Drug use: No     No Known Allergies  Current Outpatient Medications  Medication Sig Dispense Refill   buPROPion (WELLBUTRIN XL) 300 MG 24 hr tablet Take 300 mg by mouth daily.     escitalopram (LEXAPRO) 10 MG tablet Take 10 mg by mouth daily.     Multiple Vitamin (MULTI-VITAMIN) tablet Take 1 tablet by mouth daily.     ondansetron (ZOFRAN) 4 MG tablet Take 1 tablet (4 mg total) by mouth every 6 (six) hours. 12 tablet 0   No current facility-administered medications for this visit.  OBJECTIVE: There were no vitals filed for this visit.   There is no height or weight on file to calculate BMI.      General: Well-developed, well-nourished, no acute distress. Eyes: Pink conjunctiva, anicteric sclera. HEENT: Normocephalic, moist mucous membranes, clear oropharnyx. Lungs: Clear to auscultation bilaterally. Heart: Regular rate and rhythm. No rubs, murmurs, or gallops. Abdomen: Soft, nontender, nondistended. No organomegaly noted, normoactive bowel sounds. Musculoskeletal: No edema, cyanosis, or clubbing. Neuro: Alert, answering all  questions appropriately. Cranial nerves grossly intact. Skin: No rashes or petechiae noted. Psych: Normal affect. Lymphatics: No cervical, calvicular, axillary or inguinal LAD.   LAB RESULTS:  Lab Results  Component Value Date   NA 140 05/16/2020   K 3.9 05/16/2020   CL 109 05/16/2020   CO2 24 05/16/2020   GLUCOSE 83 05/16/2020   BUN 12 05/16/2020   CREATININE 1.47 (H) 05/16/2020   CALCIUM 8.4 (L) 05/16/2020   PROT 7.7 05/13/2020   PROT 7.8 05/13/2020   ALBUMIN 4.8 05/13/2020   ALBUMIN 4.7 05/13/2020   AST 40 05/13/2020   AST 40 05/13/2020   ALT 29 05/13/2020   ALT 28 05/13/2020   ALKPHOS 48 05/13/2020   ALKPHOS 48 05/13/2020   BILITOT 1.9 (H) 05/13/2020   BILITOT 1.9 (H) 05/13/2020   GFRNONAA >60 05/16/2020    Lab Results  Component Value Date   WBC 9.9 05/14/2020   NEUTROABS 8.3 (H) 05/14/2020   HGB 11.8 (L) 05/14/2020   HCT 32.8 (L) 05/14/2020   MCV 81.6 05/14/2020   PLT 121 (L) 05/14/2020    No results found for: "TIBC", "FERRITIN", "IRONPCTSAT"   STUDIES: No results found.  ASSESSMENT AND PLAN:   Steven Gill is a 26 y.o. male with pmh of of depression, migraine, subdural hematoma after fall in 2021, seizures was referred to hematology for further work-up of cervical lymphadenopathy and fevers.  #Relapsing remitting fever #Cervical lymphadenopathy #Joint swelling and stiffness #Night sweats  Patient has relapsing remitting course of fever, cervical lymphadenopathy, joint swelling, stiffness and night sweats which started in June 2023.  He had 2 rounds of antibiotics and prednisone.  His last illness was in mid October 2023 where he reports his lymph node in right neck was very enlarged which has completely resolved with prednisone.  Today, exam was unremarkable.  Labs so far.  ESR elevated to 70, CRP 21, ANA negative, RA factor normal, Monospot negative, EBV IgG elevated 442 but IgM normal.  CMV IgG/IgM negative.  CK 100.  Lyme PCR negative.   Respiratory panel negative WBC 4.2, hemoglobin 12.3, MCV 84, platelet 246.  Mild lymphopenia 0.95. CMP showed ALP mildly elevated 113 otherwise unremarkable.  He was seen by Dr. Posey Pronto of Rheum yesterday and extensive autoimmune work-up sent.  Also had x-rays of his back.  I will follow.   Discussed with the patient and mother about the differential diagnosis of autoimmune process or lymphoma. I have ordered a PET/CT scan to assess for lymphadenopathy and if there is anything concerning a need for biopsy.  Patient has been taking prednisone for few days which he finished yesterday. I expressed my concern about temporary shrinkage of the lymph nodes with prednisone and can have a false negative results on PET scan. It will take 2-3 weeks to schedule PET so will go ahead with it. Strongly advised to hold off on more prednisone. He has also been anemic since 2022.  Ordered basic anemia work-up. I will also do HIV testing.  Hepatitis B and  C was sent by Dr. Posey Pronto.  Orders Placed This Encounter  Procedures   NM PET Image Initial (PI) Skull Base To Thigh   CBC with Differential   Lactate dehydrogenase   Iron and TIBC([facility])   Vitamin B12   HIV ANTIBODY (ROUTINE TETSING W RELFEX)   Folate   TSH    RTC in 4 weeks for MD visit, discuss CT scan and labs.  Patient expressed understanding and was in agreement with this plan. He also understands that He can call clinic at any time with any questions, concerns, or complaints.   I spent a total of 45 minutes reviewing chart data, face-to-face evaluation with the patient, counseling and coordination of care as detailed above.  Jane Canary, MD   03/01/2022 9:40 AM

## 2022-03-01 NOTE — Progress Notes (Signed)
New pt referral for enlarged lymph nodes in neck. Pt mother is present today.

## 2022-03-02 LAB — HIV ANTIBODY (ROUTINE TESTING W REFLEX): HIV Screen 4th Generation wRfx: NONREACTIVE

## 2022-03-16 ENCOUNTER — Encounter: Payer: Self-pay | Admitting: Nurse Practitioner

## 2022-03-16 ENCOUNTER — Telehealth: Payer: Self-pay | Admitting: Internal Medicine

## 2022-03-16 NOTE — Telephone Encounter (Signed)
Called patient to cancel PET- no authorization- no answer- no voicemail-   Does his MD visit need to be changed?

## 2022-03-16 NOTE — Progress Notes (Signed)
Attempted peer to peer for pet. Per [employer], patient's health plan does not allow P2P. I requested appeal process be started.

## 2022-03-19 ENCOUNTER — Telehealth: Payer: Self-pay | Admitting: *Deleted

## 2022-03-19 NOTE — Telephone Encounter (Signed)
Patient's mother called to report that his PET scan had been canceled due to insurance denial. She states that her insurance did not deny this scan. The Imaging department has already scheduled his time slot. She would like advice on how to get it rescheduled ASAP.

## 2022-03-20 ENCOUNTER — Ambulatory Visit: Payer: BC Managed Care – PPO

## 2022-03-22 ENCOUNTER — Ambulatory Visit: Admission: RE | Admit: 2022-03-22 | Payer: BC Managed Care – PPO | Source: Ambulatory Visit

## 2022-03-22 ENCOUNTER — Other Ambulatory Visit: Payer: Self-pay | Admitting: *Deleted

## 2022-03-22 DIAGNOSIS — R590 Localized enlarged lymph nodes: Secondary | ICD-10-CM

## 2022-03-23 ENCOUNTER — Ambulatory Visit: Payer: BC Managed Care – PPO | Admitting: Internal Medicine

## 2022-03-23 ENCOUNTER — Ambulatory Visit
Admission: RE | Admit: 2022-03-23 | Discharge: 2022-03-23 | Disposition: A | Discharge status: Home / Self Care | Payer: BC Managed Care – PPO | Source: Ambulatory Visit | Attending: Oncology | Admitting: Oncology

## 2022-03-23 DIAGNOSIS — R590 Localized enlarged lymph nodes: Secondary | ICD-10-CM | POA: Insufficient documentation

## 2022-03-23 MED ORDER — IOHEXOL 300 MG/ML  SOLN
100.0000 mL | Freq: Once | INTRAMUSCULAR | Status: AC | PRN
  Administered 2022-03-23: 100 mL via INTRAVENOUS

## 2022-03-23 MED ORDER — IOHEXOL 300 MG/ML  SOLN: 100.0000 mL | Freq: Once | INTRAMUSCULAR | Status: DC | PRN

## 2022-03-27 ENCOUNTER — Encounter: Payer: Self-pay | Admitting: Internal Medicine

## 2022-03-27 ENCOUNTER — Inpatient Hospital Stay: Payer: BC Managed Care – PPO | Attending: Internal Medicine | Admitting: Internal Medicine

## 2022-03-27 VITALS — BP 121/61 | HR 64 | Temp 96.4°F | Resp 20 | Wt 186.1 lb

## 2022-03-27 DIAGNOSIS — R61 Generalized hyperhidrosis: Secondary | ICD-10-CM | POA: Insufficient documentation

## 2022-03-27 DIAGNOSIS — R634 Abnormal weight loss: Secondary | ICD-10-CM | POA: Insufficient documentation

## 2022-03-27 DIAGNOSIS — R509 Fever, unspecified: Secondary | ICD-10-CM | POA: Insufficient documentation

## 2022-03-27 DIAGNOSIS — M2540 Effusion, unspecified joint: Secondary | ICD-10-CM | POA: Insufficient documentation

## 2022-03-27 DIAGNOSIS — R768 Other specified abnormal immunological findings in serum: Secondary | ICD-10-CM | POA: Insufficient documentation

## 2022-03-27 DIAGNOSIS — R590 Localized enlarged lymph nodes: Secondary | ICD-10-CM | POA: Diagnosis present

## 2022-03-27 NOTE — Progress Notes (Signed)
[place]  Telephone:(336) [phone] Fax:(336) [phone]  ID: Steven Gill OB: 1996-04-27  MR#: [id number]  OZD#:[id number]  Patient Care Team: Derinda Late, MD as PCP - General (Family Medicine)  REFERRING PROVIDER: Dr. Derinda Late  REASON FOR REFERRAL: cervical lymphadenopathy, fever  HPI: Steven Gill is a 26 y.o. male with past medical history of depression, migraine, subdural hematoma after fall in 2021, seizures was referred to hematology for further work-up of cervical lymphadenopathy and fevers.  Patient was in his general state of health until June 2023 when he was on holiday at beach, he developed fevers and severe body aches which lasted for a week. It spontaneously resolved.  He then again developed fevers to 102, swelling in ankle and knees, enlarged neck lymph nodes in July 2023. He was treated with amoxicillin and his symptoms resolved.  Then similar symptoms recurred in late July which was treated with second course of amoxicillin.  He broke into diffuse rash which was thought to be drug-induced and it resolved in a week.  The symptoms then recurred in late August and he went to urgent care.  He was treated with 1 week of prednisone which resolved his symptoms.  In October 2023 he again developed fevers to 103 and was retreated with prednisone. He has night sweats almost every day.  Appetite is good.  Reports some weight loss.  Each time his symptoms relapsed, he also has associated joint stiffness casuing difficulty to walk.  Denies any travel out of the [place].  Denies any tick bites recently.  He has 2 dogs at home.  Denies any family history of lymphoma or autoimmune process.  Denies any OTC medication or herbal supplements.  He does nicotine vaping. Denies other drug use.    He was seen by Dr. Posey Pronto of [facility] rheumatology on 02/28/2022.  Labs reviewed.  ESR elevated to 70, CRP 21, ANA negative, RA factor normal, Monospot negative, EBV IgG elevated 442  but IgM normal.  CMV IgG/IgM negative.  CK 100.  Lyme PCR negative.  Respiratory panel negative WBC 4.2, hemoglobin 12.3, MCV 84, platelet 246.  Mild lymphopenia 0.95.  ALP mildly elevated 113 otherwise unremarkable.  Autoimmune work-up sent by rheumatology has been unremarkable except seronegative RA lab anti-CarP Ab mildly elevated to 32.  INTERVAL HISTORY- Patient seen today accompanied by mother to discuss CT scan results. Since I last saw him, he denies any further episodes of emesis having fevers, joint swelling or lymph node swelling.  REVIEW OF SYSTEMS:   ROS  As per HPI. Otherwise, a complete review of systems is negative.  PAST MEDICAL HISTORY: Past Medical History:  Diagnosis Date   Anxiety    Depression     PAST SURGICAL HISTORY: Past Surgical History:  Procedure Laterality Date   ANTERIOR CRUCIATE LIGAMENT REPAIR Left    ANTERIOR CRUCIATE LIGAMENT REPAIR      FAMILY HISTORY: Family History  Problem Relation Age of Onset   Lung cancer Maternal Grandfather    Other Neg Hx     HEALTH MAINTENANCE: Social History   Tobacco Use   Smoking status: Never   Smokeless tobacco: Never  Vaping Use   Vaping Use: Every day  Substance Use Topics   Alcohol use: No   Drug use: No     Allergies  Allergen Reactions   Anesthesia S-I-40 [Propofol]     Pt reports allergy of anesthesia    Current Outpatient Medications  Medication Sig Dispense Refill   buPROPion (  WELLBUTRIN XL) 300 MG 24 hr tablet Take 300 mg by mouth daily.     escitalopram (LEXAPRO) 10 MG tablet Take 10 mg by mouth daily.     Multiple Vitamin (MULTI-VITAMIN) tablet Take 1 tablet by mouth daily.     No current facility-administered medications for this visit.    OBJECTIVE: Vitals:   03/27/22 1436  BP: 121/61  Pulse: 64  Resp: 20  Temp: (!) 96.4 F (35.8 C)  SpO2: 100%     Body mass index is 23.89 kg/m.      General: Well-developed, well-nourished, no acute distress. Eyes: Pink  conjunctiva, anicteric sclera. HEENT: Normocephalic, moist mucous membranes, clear oropharnyx. Lungs: Clear to auscultation bilaterally. Heart: Regular rate and rhythm. No rubs, murmurs, or gallops. Abdomen: Soft, nontender, nondistended. No organomegaly noted, normoactive bowel sounds. Musculoskeletal: No edema, cyanosis, or clubbing. Neuro: Alert, answering all questions appropriately. Cranial nerves grossly intact. Skin: No rashes or petechiae noted. Psych: Normal affect. Lymphatics: No cervical, calvicular, axillary or inguinal LAD.   LAB RESULTS:  Lab Results  Component Value Date   NA 140 05/16/2020   K 3.9 05/16/2020   CL 109 05/16/2020   CO2 24 05/16/2020   GLUCOSE 83 05/16/2020   BUN 12 05/16/2020   CREATININE 1.47 (H) 05/16/2020   CALCIUM 8.4 (L) 05/16/2020   PROT 7.7 05/13/2020   PROT 7.8 05/13/2020   ALBUMIN 4.8 05/13/2020   ALBUMIN 4.7 05/13/2020   AST 40 05/13/2020   AST 40 05/13/2020   ALT 29 05/13/2020   ALT 28 05/13/2020   ALKPHOS 48 05/13/2020   ALKPHOS 48 05/13/2020   BILITOT 1.9 (H) 05/13/2020   BILITOT 1.9 (H) 05/13/2020   GFRNONAA >60 05/16/2020    Lab Results  Component Value Date   WBC 7.5 03/01/2022   NEUTROABS 5.1 03/01/2022   HGB 14.4 03/01/2022   HCT 45.6 03/01/2022   MCV 83.4 03/01/2022   PLT 307 03/01/2022    Lab Results  Component Value Date   TIBC 388 03/01/2022   IRONPCTSAT 14 (L) 03/01/2022     STUDIES: CT CHEST ABDOMEN PELVIS W CONTRAST  Result Date: 03/23/2022 CLINICAL DATA:  Lymphadenopathy EXAM: CT CHEST, ABDOMEN, AND PELVIS WITH CONTRAST TECHNIQUE: Multidetector CT imaging of the chest, abdomen and pelvis was performed following the standard protocol during bolus administration of intravenous contrast. RADIATION DOSE REDUCTION: This exam was performed according to the departmental dose-optimization program which includes automated exposure control, adjustment of the mA and/or kV according to patient size and/or use of  iterative reconstruction technique. CONTRAST:  159m OMNIPAQUE IOHEXOL 300 MG/ML  SOLN COMPARISON:  None Available. FINDINGS: CT CHEST FINDINGS Cardiovascular: Normal heart size. No pericardial effusion. Normal caliber thoracic aorta with no atherosclerotic disease. Mediastinum/Nodes: Esophagus and thyroid are unremarkable. No pathologically enlarged lymph nodes seen in the chest. Lungs/Pleura: Lungs are clear. No pleural effusion or pneumothorax. Musculoskeletal: No chest wall mass or suspicious bone lesions identified. CT ABDOMEN PELVIS FINDINGS Hepatobiliary: No focal liver abnormality is seen. No gallstones, gallbladder wall thickening, or biliary dilatation. Pancreas: Unremarkable. No pancreatic ductal dilatation or surrounding inflammatory changes. Spleen: Normal in size without focal abnormality. Adrenals/Urinary Tract: Adrenal glands are unremarkable. Kidneys are normal, without renal calculi, focal lesion, or hydronephrosis. Bladder is unremarkable. Stomach/Bowel: Stomach is within normal limits. Appendix is not definitely visualized, although there are no secondary findings of acute appendicitis. No evidence of bowel wall thickening, distention, or inflammatory changes. Vascular/Lymphatic: No significant vascular findings are present. No enlarged abdominal or pelvic lymph  nodes. Reproductive: Prostate is unremarkable. Other: No abdominal wall hernia or abnormality. No abdominopelvic ascites. Musculoskeletal: No acute or significant osseous findings. IMPRESSION: No enlarged lymph nodes seen in the chest, abdomen, or pelvis. Electronically Signed   By: Yetta Glassman M.D.   On: 03/23/2022 09:11   CT SOFT TISSUE NECK W CONTRAST  Result Date: 03/23/2022 CLINICAL DATA:  Lymphadenopathy subsequent to systemic illness in June of this year EXAM: CT NECK WITH CONTRAST TECHNIQUE: Multidetector CT imaging of the neck was performed using the standard protocol following the bolus administration of intravenous  contrast. RADIATION DOSE REDUCTION: This exam was performed according to the departmental dose-optimization program which includes automated exposure control, adjustment of the mA and/or kV according to patient size and/or use of iterative reconstruction technique. CONTRAST:  141m OMNIPAQUE IOHEXOL 300 MG/ML  SOLN COMPARISON:  None Available. FINDINGS: Pharynx and larynx: No mucosal or submucosal abnormality. No evidence of tonsillitis. Limited resolution due to motion degradation. Salivary glands: Parotid and submandibular glands are normal. Thyroid: Normal Lymph nodes: No enlarged or low-density nodes on either side of the neck. Normal bilateral cervical chain lymph nodes. Vascular: No abnormal vascular finding. Limited intracranial: Normal Visualized orbits: Normal Mastoids and visualized paranasal sinuses: Clear Skeleton: Normal allowing for motion degradation. Upper chest: Normal.  See results of chest CT. Other: None IMPRESSION: No abnormality seen to explain the clinical presentation. Limited resolution due to motion degradation. No enlarged or low-density lymph nodes on either side of the neck. Electronically Signed   By: MNelson ChimesM.D.   On: 03/23/2022 09:07    ASSESSMENT AND PLAN:   CMEKHI SONNis a 26y.o. male with pmh of of depression, migraine, subdural hematoma after fall in 2021, seizures was referred to hematology for further work-up of cervical lymphadenopathy and fevers.  #Relapsing remitting fever #Cervical lymphadenopathy #Joint swelling and stiffness #Night sweats  Patient has relapsing remitting course of fever, cervical lymphadenopathy, joint swelling, stiffness and night sweats which started in June 2023.  He had 2 rounds of antibiotics and prednisone.  His last illness was in mid October 2023 where he reports his lymph node in right neck was very enlarged which has completely resolved with prednisone.    ESR elevated to 70, CRP 21, ANA negative, RA factor normal, Monospot  negative, EBV IgG elevated 442 but IgM normal.  CMV IgG/IgM negative.  CK 100.  Lyme PCR negative.  Respiratory panel negative CMP showed ALP mildly elevated 113 otherwise unremarkable.  Autoimmune work-up sent by rheumatology has been unremarkable except seronegative RA lab anti-CarP Ab mildly elevated to 32.  HIV negative.    He had CT neck, chest, abdomen and pelvis with contrast was unremarkable.  I discussed with the patient and his mother in detail that my suspicion for malignant process is low.  CBC with differential is normal LDH is normal. .  Advised the patient to follow-up with Dr. PPosey Prontoof rheumatology to discuss about any autoimmune etiologies contributing to his symptoms.  I would just like to add flow cytometry on the peripheral blood also to complete a work-up.  Prescription was provided and will get lab work when he sees Dr. PPosey Pronto  No orders of the defined types were placed in this encounter. RTC as needed.  Patient expressed understanding and was in agreement with this plan. He also understands that He can call clinic at any time with any questions, concerns, or complaints.   I spent a total of 45 minutes reviewing chart  data, face-to-face evaluation with the patient, counseling and coordination of care as detailed above.  Jane Canary, MD   03/27/2022 4:29 PM

## 2022-03-27 NOTE — Progress Notes (Signed)
Patient states he has some dizziness when looking up and if he lays down sometimes. Patient states this has been ongoing since his head injury in December '21.

## 2022-03-29 ENCOUNTER — Ambulatory Visit: Payer: BC Managed Care – PPO | Admitting: Internal Medicine

## 2022-07-18 ENCOUNTER — Ambulatory Visit
Admission: EM | Admit: 2022-07-18 | Discharge: 2022-07-18 | Disposition: A | Discharge status: Home / Self Care | Payer: BC Managed Care – PPO

## 2022-07-18 DIAGNOSIS — S81811A Laceration without foreign body, right lower leg, initial encounter: Secondary | ICD-10-CM

## 2022-07-18 NOTE — Discharge Instructions (Addendum)
Change dressing daily. Watch for s/s of infection.

## 2022-07-18 NOTE — ED Provider Notes (Signed)
Steven Gill    CSN: [id number] Arrival date & time: 07/18/22  1744      History   Chief Complaint Chief Complaint  Patient presents with   Laceration    HPI Steven Gill is a 27 y.o. male.    Laceration   Presents with skin tear on his right lower leg which occurred when he was skating.  Patient states he cleaned the wound with pharmacy obtained "wound wash" and applied a bandage.  He refuses Td booster  Past Medical History:  Diagnosis Date   Anxiety    Depression     Patient Active Problem List   Diagnosis Date Noted   Cervical lymphadenopathy 03/01/2022   Fever 03/01/2022   Night sweats 03/01/2022   Bradycardia    ICH (intracerebral hemorrhage) ([place]) 05/14/2020   Fall 05/14/2020   Subdural hematoma ([place]) 05/13/2020   Hypokalemia 05/13/2020   Syncope, vasovagal 05/13/2020   Proteinuria 05/13/2020   Seizure-like activity ([place]) 05/13/2020   AKI (acute kidney injury) ([place]) 05/13/2020    Past Surgical History:  Procedure Laterality Date   ANTERIOR CRUCIATE LIGAMENT REPAIR Left    ANTERIOR CRUCIATE LIGAMENT REPAIR         Home Medications    Prior to Admission medications   Medication Sig Start Date End Date Taking? Authorizing Provider  buPROPion (WELLBUTRIN XL) 300 MG 24 hr tablet Take 300 mg by mouth daily. 11/06/21  Yes [provider]  escitalopram (LEXAPRO) 10 MG tablet Take 10 mg by mouth daily. 11/19/21  Yes [provider]  Multiple Vitamin (MULTI-VITAMIN) tablet Take 1 tablet by mouth daily.    [provider]    Family History Family History  Problem Relation Age of Onset   Lung cancer Maternal Grandfather    Other Neg Hx     Social History Social History   Tobacco Use   Smoking status: Never   Smokeless tobacco: Never  Vaping Use   Vaping Use: Every day  Substance Use Topics   Alcohol use: No   Drug use: No     Allergies   Anesthesia s-i-40 [propofol]   Review of Systems Review of  Systems   Physical Exam Triage Vital Signs ED Triage Vitals  Enc Vitals Group     BP 07/18/22 1925 (!) 142/61     Pulse Rate 07/18/22 1925 (!) 58     Resp 07/18/22 1925 18     Temp 07/18/22 1925 98.2 F (36.8 C)     Temp Source 07/18/22 1925 Oral     SpO2 07/18/22 1925 99 %     Weight --      Height --      Head Circumference --      Peak Flow --      Pain Score 07/18/22 1933 3     Pain Loc --      Pain Edu? --      Excl. in [place]? --    No data found.  Updated Vital Signs BP (!) 142/61 (BP Location: Right Arm)   Pulse (!) 58   Temp 98.2 F (36.8 C) (Oral)   Resp 18   SpO2 99%   Visual Acuity Right Eye Distance:   Left Eye Distance:   Bilateral Distance:    Right Eye Near:   Left Eye Near:    Bilateral Near:     Physical Exam Vitals reviewed.  Constitutional:      Appearance: Normal appearance.  Musculoskeletal:  Legs:  Skin:    General: Skin is warm and dry.     Findings: Wound present.  Neurological:     General: No focal deficit present.     Mental Status: He is alert and oriented to person, place, and time.  Psychiatric:        Mood and Affect: Mood normal.        Behavior: Behavior normal.      UC Treatments / Results  Labs (all labs ordered are listed, but only abnormal results are displayed) Labs Reviewed - No data to display  EKG   Radiology No results found.  Procedures Procedures (including critical care time)  Medications Ordered in UC Medications - No data to display  Initial Impression / Assessment and Plan / UC Course  I have reviewed the triage vital signs and the nursing notes.  Pertinent labs & imaging results that were available during my care of the patient were reviewed by me and considered in my medical decision making (see chart for details).   Cleaned wound and dressed with antibiotic ointment, Xeroform gauze, nonadherent gauze, Coban tape.  Instructed patient to change dressing daily and watch for signs and  symptoms of infection such as redness, swelling, drainage.   Final Clinical Impressions(s) / UC Diagnoses   Final diagnoses:  Noninfected skin tear of right leg, initial encounter     Discharge Instructions      Change dressing daily. Watch for s/s of infection.         ED Prescriptions   None    PDMP not reviewed this encounter.   Rose Phi,  07/18/22 1952

## 2022-07-18 NOTE — ED Triage Notes (Signed)
Pt fell yesterday while skating and cut open his right leg. Pt states he cleaned his laceration and bandage it. Pt is also scared of needles and does not want stiches or tdap shot.

## 2023-06-17 ENCOUNTER — Emergency Department: Payer: Self-pay

## 2023-06-17 ENCOUNTER — Emergency Department
Admission: EM | Admit: 2023-06-17 | Discharge: 2023-06-17 | Disposition: A | Discharge status: Home / Self Care | Payer: Self-pay | Attending: Emergency Medicine | Admitting: Emergency Medicine

## 2023-06-17 ENCOUNTER — Other Ambulatory Visit: Payer: Self-pay

## 2023-06-17 DIAGNOSIS — Z20822 Contact with and (suspected) exposure to covid-19: Secondary | ICD-10-CM | POA: Insufficient documentation

## 2023-06-17 DIAGNOSIS — J069 Acute upper respiratory infection, unspecified: Secondary | ICD-10-CM | POA: Insufficient documentation

## 2023-06-17 DIAGNOSIS — J9801 Acute bronchospasm: Secondary | ICD-10-CM | POA: Insufficient documentation

## 2023-06-17 LAB — RESP PANEL BY RT-PCR (RSV, FLU A&B, COVID)  RVPGX2
Influenza A by PCR: NEGATIVE
Influenza B by PCR: NEGATIVE
Resp Syncytial Virus by PCR: NEGATIVE
SARS Coronavirus 2 by RT PCR: NEGATIVE

## 2023-06-17 MED ORDER — IPRATROPIUM-ALBUTEROL 0.5-2.5 (3) MG/3ML IN SOLN
3.0000 mL | Freq: Once | RESPIRATORY_TRACT | Status: AC
  Administered 2023-06-17: 3 mL via RESPIRATORY_TRACT
  Filled 2023-06-17: qty 3

## 2023-06-17 MED ORDER — PREDNISONE 20 MG PO TABS: 60.0000 mg | ORAL_TABLET | Freq: Every day | ORAL | 0 refills | Status: AC

## 2023-06-17 MED ORDER — ALBUTEROL SULFATE HFA 108 (90 BASE) MCG/ACT IN AERS
2.0000 | INHALATION_SPRAY | Freq: Once | RESPIRATORY_TRACT | Status: AC
  Administered 2023-06-17: 2 via RESPIRATORY_TRACT
  Filled 2023-06-17: qty 6.7

## 2023-06-17 NOTE — Discharge Instructions (Signed)
You may use your albuterol inhaler 2 to 4 puffs every 2-4 hours as needed for shortness of breath, wheezing.  You may alternate Tylenol 1000 mg every 6 hours as needed for pain, fever and Ibuprofen 800 mg every 6-8 hours as needed for pain, fever.  Please take Ibuprofen with food.  Do not take more than 4000 mg of Tylenol (acetaminophen) in a 24 hour period.  You may use over-the-counter dextromethorphan, guaifenesin as needed for cough, congestion.

## 2023-06-17 NOTE — ED Triage Notes (Addendum)
Patient C/O productive cough and SOB that began two days ago. Patient does vape nicotine. Denies any fevers. Upper and lower airway inspiratory wheezing noted. Denies any history of asthma.

## 2023-06-17 NOTE — ED Provider Notes (Signed)
[facility] Provider Note    Event Date/Time   First MD Initiated Contact with Patient 06/17/23 0424     (approximate)   History   Cough and Shortness of Breath   HPI  Steven Gill is a 28 y.o. male with history of anxiety, depression who presents to the emergency department with cough, shortness of breath and wheezing.  No fever.  No history of asthma or COPD but states he does vape.   History provided by patient.    Past Medical History:  Diagnosis Date   Anxiety    Depression     Past Surgical History:  Procedure Laterality Date   ANTERIOR CRUCIATE LIGAMENT REPAIR Left    ANTERIOR CRUCIATE LIGAMENT REPAIR      MEDICATIONS:  Prior to Admission medications   Medication Sig Start Date End Date Taking? Authorizing Provider  buPROPion (WELLBUTRIN XL) 300 MG 24 hr tablet Take 300 mg by mouth daily. 11/06/21   [provider]  escitalopram (LEXAPRO) 10 MG tablet Take 10 mg by mouth daily. 11/19/21   [provider]  Multiple Vitamin (MULTI-VITAMIN) tablet Take 1 tablet by mouth daily.    [provider]    Physical Exam   Triage Vital Signs: ED Triage Vitals  Encounter Vitals Group     BP 06/17/23 0058 (!) 120/96     Systolic BP Percentile --      Diastolic BP Percentile --      Pulse Rate 06/17/23 0058 84     Resp 06/17/23 0058 20     Temp 06/17/23 0058 97.9 F (36.6 C)     Temp Source 06/17/23 0058 Oral     SpO2 06/17/23 0058 96 %     Weight 06/17/23 0104 180 lb (81.6 kg)     Height 06/17/23 0104 6\' 2"  (1.88 m)     Head Circumference --      Peak Flow --      Pain Score --      Pain Loc --      Pain Education --      Exclude from Growth Chart --     Most recent vital signs: Vitals:   06/17/23 0058 06/17/23 0524  BP: (!) 120/96 130/70  Pulse: 84 (!) 111  Resp: 20 20  Temp: 97.9 F (36.6 C) 98 F (36.7 C)  SpO2: 96% 95%    CONSTITUTIONAL: Alert, responds appropriately to questions.  Well-appearing; well-nourished HEAD: Normocephalic, atraumatic EYES: Conjunctivae clear, pupils appear equal, sclera nonicteric ENT: normal nose; moist mucous membranes NECK: Supple, normal ROM CARD: RRR; S1 and S2 appreciated RESP: Normal chest excursion without splinting or tachypnea; patient has scattered expiratory wheezing bilaterally, no rhonchi, no rales, no hypoxia or respiratory distress, speaking full sentences ABD/GI: Non-distended; soft, non-tender, no rebound, no guarding, no peritoneal signs BACK: The back appears normal EXT: Normal ROM in all joints; no deformity noted, no edema SKIN: Normal color for age and race; warm; no rash on exposed skin NEURO: Moves all extremities equally, normal speech PSYCH: The patient's mood and manner are appropriate.   ED Results / Procedures / Treatments   LABS: (all labs ordered are listed, but only abnormal results are displayed) Labs Reviewed  RESP PANEL BY RT-PCR (RSV, FLU A&B, COVID)  [id number]     EKG:   RADIOLOGY: My personal review and interpretation of imaging: Chest x-ray clear.  I have personally reviewed all radiology reports.   DG Chest Portable 1 View Result  Date: 06/17/2023 CLINICAL DATA:  Cough EXAM: PORTABLE CHEST 1 VIEW COMPARISON:  None Available. FINDINGS: The heart size and mediastinal contours are within normal limits. Both lungs are clear. The visualized skeletal structures are unremarkable. IMPRESSION: No active disease. Electronically Signed   By: Helyn Numbers M.D.   On: 06/17/2023 01:34     PROCEDURES:  Critical Care performed: No     Procedures    IMPRESSION / MDM / ASSESSMENT AND PLAN / ED COURSE  I reviewed the triage vital signs and the nursing notes.    Patient here with shortness of breath, cough and wheezing.    DIFFERENTIAL DIAGNOSIS (includes but not limited to):   Bronchitis causing bronchospasm, pneumonia, viral URI, asthma   Patient's presentation is most consistent with  acute complicated illness / injury requiring diagnostic workup.   PLAN: COVID, flu and RSV negative.  Chest x-ray reviewed and interpreted by myself and the radiologist and is clear.  He is wheezing here but not in distress and has no hypoxia.  Will give breathing treatment.  Will start him on steroids as well and give him a prescription for an albuterol inhaler.  No indication for antibiotics today.   MEDICATIONS GIVEN IN ED: Medications  ipratropium-albuterol (DUONEB) 0.5-2.5 (3) MG/3ML nebulizer solution 3 mL (3 mLs Nebulization Given 06/17/23 0434)  albuterol (VENTOLIN HFA) 108 (90 Base) MCG/ACT inhaler 2 puff (2 puffs Inhalation Given 06/17/23 0539)  ipratropium-albuterol (DUONEB) 0.5-2.5 (3) MG/3ML nebulizer solution 3 mL (3 mLs Nebulization Given 06/17/23 0506)     ED COURSE: Breath sounds significantly improved after 2 breathing treatments.  Patient feeling better.  Will discharge.    At this time, I do not feel there is any life-threatening condition present. I reviewed all nursing notes, vitals, pertinent previous records.  All lab and urine results, EKGs, imaging ordered have been independently reviewed and interpreted by myself.  I reviewed all available radiology reports from any imaging ordered this visit.  Based on my assessment, I feel the patient is safe to be discharged home without further emergent workup and can continue workup as an outpatient as needed. Discussed all findings, treatment plan as well as usual and customary return precautions.  They verbalize understanding and are comfortable with this plan.  Outpatient follow-up has been provided as needed.  All questions have been answered.   CONSULTS:  none   OUTSIDE RECORDS REVIEWED: Reviewed last PCP note in October 2024.       FINAL CLINICAL IMPRESSION(S) / ED DIAGNOSES   Final diagnoses:  Viral URI with cough  Bronchospasm     Rx / DC Orders   ED Discharge Orders          Ordered    predniSONE  (DELTASONE) 20 MG tablet  Daily        06/17/23 0435             Note:  This document was prepared using Dragon voice recognition software and may include unintentional dictation errors.   Wynne Rozak, Layla Maw, DO 06/17/23 [phone]

## 2023-06-17 NOTE — ED Notes (Signed)
Patient is respectfully refusing blood work at this time. He realizes that it may be necessary depending on Provider recommendations and will consider it later.
